# Patient Record
Sex: Male | Born: 1940 | Race: Black or African American | Hispanic: No | Marital: Married | Smoking: Former smoker
Health system: Southern US, Community
[De-identification: ages and names within clinical notes are randomized; demographics above are authoritative.]

## PROBLEM LIST (undated history)

## (undated) DIAGNOSIS — C61 Malignant neoplasm of prostate: Secondary | ICD-10-CM

## (undated) DIAGNOSIS — I480 Paroxysmal atrial fibrillation: Secondary | ICD-10-CM

## (undated) DIAGNOSIS — Z86718 Personal history of other venous thrombosis and embolism: Secondary | ICD-10-CM

## (undated) DIAGNOSIS — N182 Chronic kidney disease, stage 2 (mild): Secondary | ICD-10-CM

## (undated) DIAGNOSIS — Z86711 Personal history of pulmonary embolism: Secondary | ICD-10-CM

## (undated) DIAGNOSIS — I2510 Atherosclerotic heart disease of native coronary artery without angina pectoris: Secondary | ICD-10-CM

## (undated) DIAGNOSIS — I5030 Unspecified diastolic (congestive) heart failure: Secondary | ICD-10-CM

## (undated) DIAGNOSIS — R600 Localized edema: Secondary | ICD-10-CM

## (undated) DIAGNOSIS — I252 Old myocardial infarction: Secondary | ICD-10-CM

## (undated) DIAGNOSIS — E119 Type 2 diabetes mellitus without complications: Secondary | ICD-10-CM

## (undated) DIAGNOSIS — Z87438 Personal history of other diseases of male genital organs: Secondary | ICD-10-CM

## (undated) DIAGNOSIS — I10 Essential (primary) hypertension: Secondary | ICD-10-CM

## (undated) DIAGNOSIS — N529 Male erectile dysfunction, unspecified: Secondary | ICD-10-CM

## (undated) DIAGNOSIS — E785 Hyperlipidemia, unspecified: Secondary | ICD-10-CM

## (undated) DIAGNOSIS — E291 Testicular hypofunction: Secondary | ICD-10-CM

## (undated) DIAGNOSIS — Z87898 Personal history of other specified conditions: Secondary | ICD-10-CM

## (undated) DIAGNOSIS — M1990 Unspecified osteoarthritis, unspecified site: Secondary | ICD-10-CM

## (undated) HISTORY — DX: Hyperlipidemia, unspecified: E78.5

## (undated) HISTORY — PX: SKIN GRAFT: SHX250

## (undated) HISTORY — DX: Atherosclerotic heart disease of native coronary artery without angina pectoris: I25.10

## (undated) HISTORY — PX: HEMORRHOID SURGERY: SHX153

## (undated) HISTORY — PX: PROSTATE BIOPSY: SHX241

## (undated) HISTORY — PX: CORONARY ARTERY BYPASS GRAFT: SHX141

## (undated) HISTORY — PX: TRANSTHORACIC ECHOCARDIOGRAM: SHX275

## (undated) HISTORY — PX: CARDIOVASCULAR STRESS TEST: SHX262

## (undated) HISTORY — PX: CARDIAC CATHETERIZATION: SHX172

## (undated) HISTORY — DX: Essential (primary) hypertension: I10

## (undated) HISTORY — DX: Unspecified osteoarthritis, unspecified site: M19.90

---

## 1999-07-08 ENCOUNTER — Ambulatory Visit (HOSPITAL_COMMUNITY): Admission: RE | Admit: 1999-07-08 | Discharge: 1999-07-08 | Payer: Self-pay | Admitting: Urology

## 2001-07-25 ENCOUNTER — Encounter (INDEPENDENT_AMBULATORY_CARE_PROVIDER_SITE_OTHER): Payer: Self-pay | Admitting: Specialist

## 2001-07-25 ENCOUNTER — Ambulatory Visit (HOSPITAL_COMMUNITY): Admission: RE | Admit: 2001-07-25 | Discharge: 2001-07-25 | Payer: Self-pay | Admitting: *Deleted

## 2007-04-12 ENCOUNTER — Encounter: Admission: RE | Admit: 2007-04-12 | Discharge: 2007-04-12 | Payer: Self-pay | Admitting: Internal Medicine

## 2008-06-20 ENCOUNTER — Encounter: Admission: RE | Admit: 2008-06-20 | Discharge: 2008-09-18 | Payer: Self-pay | Admitting: Internal Medicine

## 2009-07-03 DIAGNOSIS — I480 Paroxysmal atrial fibrillation: Secondary | ICD-10-CM

## 2009-07-03 HISTORY — DX: Paroxysmal atrial fibrillation: I48.0

## 2009-07-20 ENCOUNTER — Ambulatory Visit: Payer: Self-pay | Admitting: Cardiology

## 2009-07-20 ENCOUNTER — Inpatient Hospital Stay (HOSPITAL_COMMUNITY): Admission: EM | Admit: 2009-07-20 | Discharge: 2009-07-28 | Payer: Self-pay | Admitting: Emergency Medicine

## 2009-07-20 DIAGNOSIS — I252 Old myocardial infarction: Secondary | ICD-10-CM

## 2009-07-20 HISTORY — DX: Old myocardial infarction: I25.2

## 2009-07-22 ENCOUNTER — Encounter: Payer: Self-pay | Admitting: Cardiology

## 2009-07-22 ENCOUNTER — Ambulatory Visit: Payer: Self-pay | Admitting: Cardiothoracic Surgery

## 2009-07-23 ENCOUNTER — Encounter: Payer: Self-pay | Admitting: Cardiothoracic Surgery

## 2009-07-24 ENCOUNTER — Encounter: Payer: Self-pay | Admitting: Cardiology

## 2009-08-02 DIAGNOSIS — I5030 Unspecified diastolic (congestive) heart failure: Secondary | ICD-10-CM

## 2009-08-02 HISTORY — DX: Unspecified diastolic (congestive) heart failure: I50.30

## 2009-08-05 ENCOUNTER — Ambulatory Visit: Payer: Self-pay | Admitting: Cardiology

## 2009-08-05 ENCOUNTER — Inpatient Hospital Stay (HOSPITAL_COMMUNITY): Admission: EM | Admit: 2009-08-05 | Discharge: 2009-08-17 | Payer: Self-pay | Admitting: Emergency Medicine

## 2009-08-05 DIAGNOSIS — Z86718 Personal history of other venous thrombosis and embolism: Secondary | ICD-10-CM

## 2009-08-05 DIAGNOSIS — Z86711 Personal history of pulmonary embolism: Secondary | ICD-10-CM

## 2009-08-05 HISTORY — DX: Personal history of pulmonary embolism: Z86.711

## 2009-08-05 HISTORY — DX: Personal history of other venous thrombosis and embolism: Z86.718

## 2009-08-06 ENCOUNTER — Encounter (INDEPENDENT_AMBULATORY_CARE_PROVIDER_SITE_OTHER): Payer: Self-pay | Admitting: Internal Medicine

## 2009-08-06 ENCOUNTER — Ambulatory Visit: Payer: Self-pay | Admitting: Surgery

## 2009-08-06 ENCOUNTER — Ambulatory Visit: Payer: Self-pay | Admitting: Oncology

## 2009-08-19 ENCOUNTER — Ambulatory Visit: Payer: Self-pay | Admitting: Cardiology

## 2009-08-19 ENCOUNTER — Encounter: Payer: Self-pay | Admitting: Cardiology

## 2009-08-19 LAB — CONVERTED CEMR LAB

## 2009-08-26 ENCOUNTER — Ambulatory Visit: Payer: Self-pay | Admitting: Cardiology

## 2009-08-29 ENCOUNTER — Encounter (HOSPITAL_COMMUNITY): Admission: RE | Admit: 2009-08-29 | Discharge: 2009-11-01 | Payer: Self-pay | Admitting: Cardiology

## 2009-08-30 DIAGNOSIS — E785 Hyperlipidemia, unspecified: Secondary | ICD-10-CM | POA: Insufficient documentation

## 2009-08-30 DIAGNOSIS — E669 Obesity, unspecified: Secondary | ICD-10-CM | POA: Insufficient documentation

## 2009-08-30 DIAGNOSIS — I4891 Unspecified atrial fibrillation: Secondary | ICD-10-CM | POA: Insufficient documentation

## 2009-08-30 DIAGNOSIS — E1169 Type 2 diabetes mellitus with other specified complication: Secondary | ICD-10-CM | POA: Insufficient documentation

## 2009-08-30 DIAGNOSIS — I25810 Atherosclerosis of coronary artery bypass graft(s) without angina pectoris: Secondary | ICD-10-CM | POA: Insufficient documentation

## 2009-08-30 DIAGNOSIS — Z86711 Personal history of pulmonary embolism: Secondary | ICD-10-CM | POA: Insufficient documentation

## 2009-08-30 DIAGNOSIS — I82409 Acute embolism and thrombosis of unspecified deep veins of unspecified lower extremity: Secondary | ICD-10-CM | POA: Insufficient documentation

## 2009-08-30 DIAGNOSIS — I5032 Chronic diastolic (congestive) heart failure: Secondary | ICD-10-CM | POA: Insufficient documentation

## 2009-08-30 DIAGNOSIS — I2789 Other specified pulmonary heart diseases: Secondary | ICD-10-CM | POA: Insufficient documentation

## 2009-08-30 DIAGNOSIS — I219 Acute myocardial infarction, unspecified: Secondary | ICD-10-CM | POA: Insufficient documentation

## 2009-08-30 DIAGNOSIS — I498 Other specified cardiac arrhythmias: Secondary | ICD-10-CM | POA: Insufficient documentation

## 2009-09-02 ENCOUNTER — Ambulatory Visit: Payer: Self-pay | Admitting: Cardiovascular Disease

## 2009-09-04 ENCOUNTER — Telehealth: Payer: Self-pay | Admitting: Cardiology

## 2009-09-04 ENCOUNTER — Ambulatory Visit: Payer: Self-pay | Admitting: Cardiology

## 2009-09-10 ENCOUNTER — Ambulatory Visit: Payer: Self-pay | Admitting: Cardiology

## 2009-09-10 LAB — CONVERTED CEMR LAB: POC INR: 3.1

## 2009-09-12 ENCOUNTER — Encounter: Payer: Self-pay | Admitting: Cardiology

## 2009-09-12 ENCOUNTER — Ambulatory Visit: Payer: Self-pay | Admitting: Cardiothoracic Surgery

## 2009-09-12 ENCOUNTER — Encounter: Admission: RE | Admit: 2009-09-12 | Discharge: 2009-09-12 | Payer: Self-pay | Admitting: Cardiothoracic Surgery

## 2009-09-13 ENCOUNTER — Ambulatory Visit: Payer: Self-pay | Admitting: Cardiology

## 2009-09-13 LAB — CONVERTED CEMR LAB
Chloride: 103 meq/L (ref 96–112)
Magnesium: 2 mg/dL (ref 1.5–2.5)
Potassium: 4.4 meq/L (ref 3.5–5.1)
Sodium: 142 meq/L (ref 135–145)

## 2009-09-18 ENCOUNTER — Telehealth: Payer: Self-pay | Admitting: Cardiology

## 2009-09-20 ENCOUNTER — Telehealth: Payer: Self-pay | Admitting: Cardiology

## 2009-09-23 ENCOUNTER — Telehealth: Payer: Self-pay | Admitting: Cardiology

## 2009-09-24 ENCOUNTER — Ambulatory Visit: Payer: Self-pay | Admitting: Cardiovascular Disease

## 2009-09-24 LAB — CONVERTED CEMR LAB: POC INR: 1.5

## 2009-09-27 ENCOUNTER — Encounter: Payer: Self-pay | Admitting: Cardiology

## 2009-10-02 ENCOUNTER — Telehealth: Payer: Self-pay | Admitting: Cardiology

## 2009-10-08 ENCOUNTER — Ambulatory Visit: Payer: Self-pay | Admitting: Internal Medicine

## 2009-10-08 LAB — CONVERTED CEMR LAB: POC INR: 2.1

## 2009-10-16 ENCOUNTER — Encounter: Payer: Self-pay | Admitting: Cardiology

## 2009-10-18 ENCOUNTER — Ambulatory Visit: Payer: Self-pay | Admitting: Cardiology

## 2009-10-18 DIAGNOSIS — I252 Old myocardial infarction: Secondary | ICD-10-CM | POA: Insufficient documentation

## 2009-10-23 ENCOUNTER — Ambulatory Visit: Payer: Self-pay | Admitting: Cardiology

## 2009-10-23 DIAGNOSIS — R609 Edema, unspecified: Secondary | ICD-10-CM | POA: Insufficient documentation

## 2009-10-28 ENCOUNTER — Telehealth: Payer: Self-pay | Admitting: Cardiology

## 2009-10-28 LAB — CONVERTED CEMR LAB
ALT: 87 units/L — ABNORMAL HIGH (ref 0–53)
Albumin: 3.8 g/dL (ref 3.5–5.2)
CO2: 26 meq/L (ref 19–32)
Calcium: 9.1 mg/dL (ref 8.4–10.5)
Chloride: 107 meq/L (ref 96–112)
Cholesterol: 197 mg/dL (ref 0–200)
Creatinine, Ser: 1.1 mg/dL (ref 0.4–1.5)
Glucose, Bld: 127 mg/dL — ABNORMAL HIGH (ref 70–99)
HDL: 63.6 mg/dL (ref >39.00)
Total Protein: 6.8 g/dL (ref 6.0–8.3)
Triglycerides: 118 mg/dL (ref 0.0–149.0)

## 2009-11-02 ENCOUNTER — Encounter (HOSPITAL_COMMUNITY): Admission: RE | Admit: 2009-11-02 | Discharge: 2009-12-06 | Payer: Self-pay | Admitting: Cardiology

## 2009-11-05 ENCOUNTER — Ambulatory Visit: Payer: Self-pay | Admitting: Cardiology

## 2009-11-27 ENCOUNTER — Ambulatory Visit: Payer: Self-pay | Admitting: Internal Medicine

## 2009-11-27 ENCOUNTER — Ambulatory Visit: Payer: Self-pay | Admitting: Cardiology

## 2009-11-27 LAB — CONVERTED CEMR LAB: POC INR: 1.6

## 2009-11-29 ENCOUNTER — Telehealth (INDEPENDENT_AMBULATORY_CARE_PROVIDER_SITE_OTHER): Payer: Self-pay | Admitting: *Deleted

## 2009-12-09 ENCOUNTER — Telehealth: Payer: Self-pay | Admitting: Cardiology

## 2009-12-10 ENCOUNTER — Ambulatory Visit: Payer: Self-pay | Admitting: Cardiology

## 2009-12-11 ENCOUNTER — Telehealth (INDEPENDENT_AMBULATORY_CARE_PROVIDER_SITE_OTHER): Payer: Self-pay | Admitting: *Deleted

## 2009-12-11 ENCOUNTER — Ambulatory Visit: Payer: Self-pay | Admitting: Cardiovascular Disease

## 2009-12-11 LAB — CONVERTED CEMR LAB: POC INR: 1.9

## 2009-12-12 ENCOUNTER — Telehealth: Payer: Self-pay | Admitting: Cardiology

## 2009-12-12 ENCOUNTER — Ambulatory Visit (HOSPITAL_COMMUNITY): Admission: RE | Admit: 2009-12-12 | Discharge: 2009-12-12 | Payer: Self-pay | Admitting: Cardiology

## 2009-12-12 DIAGNOSIS — R05 Cough: Secondary | ICD-10-CM

## 2009-12-12 DIAGNOSIS — R059 Cough, unspecified: Secondary | ICD-10-CM | POA: Insufficient documentation

## 2009-12-19 ENCOUNTER — Ambulatory Visit: Payer: Self-pay | Admitting: Cardiothoracic Surgery

## 2009-12-19 ENCOUNTER — Encounter: Payer: Self-pay | Admitting: Cardiology

## 2010-01-01 ENCOUNTER — Ambulatory Visit: Payer: Self-pay | Admitting: Internal Medicine

## 2010-01-22 ENCOUNTER — Ambulatory Visit: Payer: Self-pay | Admitting: Cardiology

## 2010-02-25 ENCOUNTER — Ambulatory Visit
Admission: RE | Admit: 2010-02-25 | Discharge: 2010-02-25 | Payer: Self-pay | Source: Home / Self Care | Attending: Cardiology | Admitting: Cardiology

## 2010-02-25 ENCOUNTER — Other Ambulatory Visit: Payer: Self-pay | Admitting: Cardiology

## 2010-02-25 ENCOUNTER — Ambulatory Visit: Admission: RE | Admit: 2010-02-25 | Discharge: 2010-02-25 | Payer: Self-pay | Source: Home / Self Care

## 2010-02-25 ENCOUNTER — Encounter: Payer: Self-pay | Admitting: Cardiology

## 2010-02-25 DIAGNOSIS — I10 Essential (primary) hypertension: Secondary | ICD-10-CM | POA: Insufficient documentation

## 2010-02-25 DIAGNOSIS — I8010 Phlebitis and thrombophlebitis of unspecified femoral vein: Secondary | ICD-10-CM | POA: Insufficient documentation

## 2010-02-25 LAB — CONVERTED CEMR LAB
INR: 1.9
POC INR: 1.9

## 2010-02-25 LAB — CBC WITH DIFFERENTIAL/PLATELET
Basophils Relative: 0.2 % (ref 0.0–3.0)
Eosinophils Relative: 3.1 % (ref 0.0–5.0)
HCT: 41.5 % (ref 39.0–52.0)
Lymphs Abs: 3.2 10*3/uL (ref 0.7–4.0)
MCV: 85 fl (ref 78.0–100.0)
Monocytes Absolute: 0.6 10*3/uL (ref 0.1–1.0)
Neutro Abs: 3.6 10*3/uL (ref 1.4–7.7)
Platelets: 185 10*3/uL (ref 150.0–400.0)
WBC: 7.7 10*3/uL (ref 4.5–10.5)

## 2010-02-27 ENCOUNTER — Encounter: Payer: Self-pay | Admitting: Cardiology

## 2010-03-06 NOTE — Letter (Signed)
Summary: [facility] Office Visit   [facility] Office Visit   Imported By: Roderic Ovens 10/18/2009 16:16:59  _____________________________________________________________________  External Attachment:    Type:   Image     Comment:   External Document

## 2010-03-06 NOTE — Letter (Signed)
Summary: [facility]  [facility]   Imported By: Marylou Mccoy 09/26/2009 09:38:04  _____________________________________________________________________  External Attachment:    Type:   Image     Comment:   External Document

## 2010-03-06 NOTE — Progress Notes (Signed)
  Phone Note Outgoing Call   Call placed by: Lisabeth Devoid RN,  December 12, 2009 10:26 AM Call placed to: Patient Summary of Call: cough  Follow-up for Phone Call        I spoke with patient and he will go to [facility] for pa &lat chest xray due to cough.  He will stay of losartan. Mylo Red RN  New Problems: COUGH [phone])   New Problems: COUGH (ICD-786.2)  Appended Document:   Reviewed Juanito Doom, MD
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
## 2010-03-06 NOTE — Medication Information (Signed)
Summary: rov/ewj  Anticoagulant Therapy  Managed by: Leota Sauers, PharmD, BCPS, CPP Referring MD: Dr Daleen Squibb PCP: Dr Kellie Shropshire Supervising MD: Gala Romney MD, Reuel Boom Indication 1: DVT (451.1) Indication 2: Pulmonary Embolism (415.1) Valve Type: atrial fibrillation (427.31) Lab Used: [facility] [place] Site: [address] INR POC 2.1 INR RANGE 2.0-3.0  Dietary changes: no    Health status changes: no    Bleeding/hemorrhagic complications: no    Recent/future hospitalizations: no    Any changes in medication regimen? no    Recent/future dental: no  Any missed doses?: no       Is patient compliant with meds? yes       Current Medications (verified): 1)  Amiodarone Hcl 400 Mg Tabs (Amiodarone Hcl) .Marland Kitchen.. 1 Tablet Daily 2)  Warfarin Sodium 5 Mg Tabs (Warfarin Sodium) .... Take As Directed By Coumadin Clinic. 3)  Ferrous Sulfate 325 (65 Fe) Mg Tabs (Ferrous Sulfate) .Marland Kitchen.. 1 Tab Two Times A Day 4)  Furosemide 40 Mg Tabs (Furosemide) .Marland Kitchen.. 1 Tab Once Daily 5)  Metformin Hcl 500 Mg Tabs (Metformin Hcl) .Marland Kitchen.. 1 Tab Two Times A Day 6)  Aspirin Ec 325 Mg Tbec (Aspirin) .... Take One Tablet By Mouth Daily 7)  Metoprolol Tartrate 25 Mg Tabs (Metoprolol Tartrate) .... 1/2 Tab Two Times A Day 8)  Actos 30 Mg Tabs (Pioglitazone Hcl) .Marland Kitchen.. 1 Tab Once Daily 9)  Crestor 40 Mg Tabs (Rosuvastatin Calcium) .Marland Kitchen.. 1 Tab Once Daily 10)  Miralax  Powd (Polyethylene Glycol 3350) .... Use As Directed  Allergies (verified): 1)  ! Heparin  Anticoagulation Management History:      The patient is taking warfarin and comes in today for a routine follow up visit.  Positive risk factors for bleeding include an age of 98 years or older and presence of serious comorbidities.  The bleeding index is 'intermediate risk'.  Positive CHADS2 values include History of CHF and History of Diabetes.  Negative CHADS2 values include Age > 49 years old.  Anticoagulation responsible provider: Bensimhon MD, Reuel Boom.   INR POC: 2.1.  Cuvette Lot#: [id number].  Exp: 11/2010.    Anticoagulation Management Assessment/Plan:      The patient's current anticoagulation dose is Warfarin sodium 5 mg tabs: Take as directed by coumadin clinic..  The target INR is 2.0-3.0.  The next INR is due 11/05/2009.  Anticoagulation instructions were given to patient.  Results were reviewed/authorized by Leota Sauers, PharmD, BCPS, CPP.         Prior Anticoagulation Instructions: INR 1.5  Take 1.5 tablets today, then take 1 tablet tomorrow, then resume same dosage 1 tablet daily except 1/2 tablet on Mondays, Wednesdays, and Fridays.  Recheck in 2 weeks.    Current Anticoagulation Instructions: INR 2.1  coumadin 1 tab = 5mg  Tu, Thu, Sat and Sun 1/2 tab = 2.5mg  Mon, Wed, Fri

## 2010-03-06 NOTE — Medication Information (Signed)
Summary: rov/sp  Anticoagulant Therapy  Managed by: Leota Sauers, PharmD, BCPS, CPP Referring MD: Dr Daleen Squibb PCP: Dr Kellie Shropshire Supervising MD: Gala Romney MD, Reuel Boom Indication 1: DVT (451.1) Indication 2: Pulmonary Embolism (415.1) Valve Type: atrial fibrillation (427.31) Lab Used: [facility] [place] Site: [address] INR POC 2.4 INR RANGE 2.0-3.0  Dietary changes: no    Health status changes: no    Bleeding/hemorrhagic complications: no    Recent/future hospitalizations: no    Any changes in medication regimen? no    Recent/future dental: no  Any missed doses?: no       Is patient compliant with meds? yes       Current Medications (verified): 1)  Warfarin Sodium 5 Mg Tabs (Warfarin Sodium) .... Take As Directed By Coumadin Clinic. 2)  Ferrous Sulfate 325 (65 Fe) Mg Tabs (Ferrous Sulfate) .Marland Kitchen.. 1 Tab Two Times A Day 3)  Furosemide 40 Mg Tabs (Furosemide) .... Take 1/2 Tablet Each Day. 4)  Metformin Hcl 500 Mg Tabs (Metformin Hcl) .... Take 1 Tab If Glucose Readings Are From 100-125 ....take 1 1/2 Tab If Readings 126-160...take 2 Tab If Reading Is Over 160 5)  Aspirin 81 Mg Tbec (Aspirin) .... Take One Tablet By Mouth Daily 6)  Crestor 40 Mg Tabs (Rosuvastatin Calcium) .Marland Kitchen.. 1 Tab Once Daily 7)  Miralax  Powd (Polyethylene Glycol 3350) .... Use As Directed 8)  Metamucil 30.9 % Powd (Psyllium) .... As Needed 9)  Losartan Potassium 25 Mg Tabs (Losartan Potassium) .... Take 1 Tablet Daily 10)  Viagra 50 Mg Tabs (Sildenafil Citrate) .... Take 1 Tablet As Needed 11)  Zyrtec Allergy 10 Mg Tabs (Cetirizine Hcl) .... Once Daily Prn  Allergies (verified): 1)  ! Heparin  Anticoagulation Management History:      The patient is taking warfarin and comes in today for a routine follow up visit.  Positive risk factors for bleeding include an age of 70 years or older and presence of serious comorbidities.  The bleeding index is 'intermediate risk'.  Positive CHADS2 values  include History of CHF and History of Diabetes.  Negative CHADS2 values include Age > 70 years old.  Anticoagulation responsible Viet Kemmerer: Bensimhon MD, Reuel Boom.  INR POC: 2.4.  Cuvette Lot#: [id number].  Exp: 01/2011.    Anticoagulation Management Assessment/Plan:      The patient's current anticoagulation dose is Warfarin sodium 5 mg tabs: Take as directed by coumadin clinic..  The target INR is 2.0-3.0.  The next INR is due 02/25/2010.  Anticoagulation instructions were given to patient.  Results were reviewed/authorized by Leota Sauers, PharmD, BCPS, CPP.         Prior Anticoagulation Instructions: INR 2.4  Continue taking Coumadin 1 tab (5 mg) every day except for Coumadin 0.5 tab (2.5 mg) on Mondays. Return to clinic in 3 weeks.   Current Anticoagulation Instructions: INR 2.4  Coumadin 5mg  tab  - take 1 tab each day EXCEPT 1/2 tab on MON

## 2010-03-06 NOTE — Medication Information (Signed)
Summary: rov coumadin - [facility]  Anticoagulant Therapy  Managed by: Weston Brass, PharmD Referring MD: Dr Daleen Squibb PCP: Dr Kellie Shropshire Supervising MD: Shirlee Latch MD, Freida Busman Indication 1: DVT (451.1) Indication 2: Pulmonary Embolism (415.1) Valve Type: atrial fibrillation (427.31) Lab Used: [facility] [place] Site: [address] INR POC 2.2 INR RANGE 2.0-3.0  Dietary changes: no    Health status changes: no    Bleeding/hemorrhagic complications: no    Recent/future hospitalizations: no    Any changes in medication regimen? yes       Details: Amiodarone is now 200mg  instead of 400mg  2 weeks ago.  Recent/future dental: no  Any missed doses?: no       Is patient compliant with meds? yes       Current Medications (verified): 1)  Amiodarone Hcl 400 Mg Tabs (Amiodarone Hcl) .... 1/2 Tab Once A Day 2)  Warfarin Sodium 5 Mg Tabs (Warfarin Sodium) .... Take As Directed By Coumadin Clinic. 3)  Ferrous Sulfate 325 (65 Fe) Mg Tabs (Ferrous Sulfate) .Marland Kitchen.. 1 Tab Two Times A Day 4)  Furosemide 40 Mg Tabs (Furosemide) .... Take 1/2 Tablet Each Day. 5)  Metformin Hcl 500 Mg Tabs (Metformin Hcl) .... Take 1 Tab If Glucose Readings Are From 100-125 ....take 1 1/2 Tab If Readings 126-160...take 2 Tab If Reading Is Over 160 6)  Aspirin Ec 325 Mg Tbec (Aspirin) .... Take One Tablet By Mouth Daily 7)  Metoprolol Tartrate 25 Mg Tabs (Metoprolol Tartrate) .... 1/2 Tab Once A Day For 1 Week.  Then Stop. 8)  Crestor 40 Mg Tabs (Rosuvastatin Calcium) .Marland Kitchen.. 1 Tab Once Daily 9)  Miralax  Powd (Polyethylene Glycol 3350) .... Use As Directed  Allergies: 1)  ! Heparin  Anticoagulation Management History:      The patient is taking warfarin and comes in today for a routine follow up visit.  Positive risk factors for bleeding include an age of 70 years or older and presence of serious comorbidities.  The bleeding index is 'intermediate risk'.  Positive CHADS2 values include History of CHF and History of  Diabetes.  Negative CHADS2 values include Age > 72 years old.  Anticoagulation responsible Sheilah Rayos: Shirlee Latch MD, [place].  INR POC: 2.2.  Cuvette Lot#: [id number].  Exp: 12/2010.    Anticoagulation Management Assessment/Plan:      The patient's current anticoagulation dose is Warfarin sodium 5 mg tabs: Take as directed by coumadin clinic..  The target INR is 2.0-3.0.  The next INR is due 12/03/2009.  Anticoagulation instructions were given to patient.  Results were reviewed/authorized by Weston Brass, PharmD.  He was notified by Ilean Skill D candidate.         Prior Anticoagulation Instructions: INR 2.1  coumadin 1 tab = 5mg  Tu, Thu, Sat and Sun 1/2 tab = 2.5mg  Mon, Wed, Fri  Current Anticoagulation Instructions: INR 2.2  Continue to take 1 tablet everyday except for 1/2 tablet on Monday, Wednesday, and Friday. Recheck in 4 weeks.
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
## 2010-03-06 NOTE — Medication Information (Signed)
Summary: rov/tm  Anticoagulant Therapy  Managed by: Reina Fuse, PharmD Referring MD: Dr Daleen Squibb PCP: Dr Kellie Shropshire Supervising MD: Myrtis Ser MD, Tinnie Gens Indication 1: DVT (451.1) Indication 2: Pulmonary Embolism (415.1) Valve Type: atrial fibrillation (427.31) Lab Used: [facility] [place] Site: [address] INR POC 3.1 INR RANGE 2.0-3.0  Dietary changes: no    Health status changes: no    Bleeding/hemorrhagic complications: no    Recent/future hospitalizations: no    Any changes in medication regimen? yes       Details: Stopped protonix, MVI, and docusate since visit with Dr. Daleen Squibb.  Recent/future dental: no  Any missed doses?: no       Is patient compliant with meds? yes       Current Medications (verified): 1)  Amiodarone Hcl 400 Mg Tabs (Amiodarone Hcl) .Marland Kitchen.. 1 Tablet Daily 2)  Warfarin Sodium 5 Mg Tabs (Warfarin Sodium) .... Take As Directed By Coumadin Clinic. 3)  Ferrous Sulfate 325 (65 Fe) Mg Tabs (Ferrous Sulfate) .Marland Kitchen.. 1 Tab Two Times A Day 4)  Furosemide 40 Mg Tabs (Furosemide) .Marland Kitchen.. 1 Tab Once Daily 5)  Metformin Hcl 500 Mg Tabs (Metformin Hcl) .Marland Kitchen.. 1 Tab Two Times A Day 6)  Aspirin Ec 325 Mg Tbec (Aspirin) .... Take One Tablet By Mouth Daily 7)  Metoprolol Tartrate 25 Mg Tabs (Metoprolol Tartrate) .... 1/2 Tab Two Times A Day 8)  Actos 30 Mg Tabs (Pioglitazone Hcl) .Marland Kitchen.. 1 Tab Once Daily 9)  Crestor 40 Mg Tabs (Rosuvastatin Calcium) .Marland Kitchen.. 1 Tab Once Daily 10)  Miralax  Powd (Polyethylene Glycol 3350) .... Use As Directed  Allergies (verified): 1)  ! Heparin  Anticoagulation Management History:      The patient is taking warfarin and comes in today for a routine follow up visit.  Positive risk factors for bleeding include an age of 70 years or older and presence of serious comorbidities.  The bleeding index is 'intermediate risk'.  Positive CHADS2 values include History of CHF and History of Diabetes.  Negative CHADS2 values include Age > 8 years old.   Anticoagulation responsible provider: Myrtis Ser MD, Tinnie Gens.  INR POC: 3.1.  Cuvette Lot#: [id number].  Exp: 11/2010.    Anticoagulation Management Assessment/Plan:      The patient's current anticoagulation dose is Warfarin sodium 5 mg tabs: Take as directed by coumadin clinic..  The target INR is 2.0-3.0.  The next INR is due 09/24/2009.  Anticoagulation instructions were given to patient.  Results were reviewed/authorized by Reina Fuse, PharmD.  He was notified by Reina Fuse PharmD.         Prior Anticoagulation Instructions: INR 2.4 Continue 1 pill everyday except 1/2 pill on Mondays, Wednesdays and Fridays. Recheck in one week.   Current Anticoagulation Instructions: INR 3.1  Hold Coumadin today. Then continue Coumadin 1 tab (5 mg) on Sun, Tues, Thur, Sat and Coumadin 0.5 tab (2.5 mg). Return to clinic in 2 weeks.

## 2010-03-06 NOTE — Progress Notes (Signed)
Summary: [facility]: Office Visit  [facility]: Office Visit   Imported By: Earl Many 02/11/2010 16:29:24  _____________________________________________________________________  External Attachment:    Type:   Image     Comment:   External Document

## 2010-03-06 NOTE — Miscellaneous (Signed)
Summary: [facility] Progress Note   [facility]   Imported By: Roderic Ovens 08/29/2009 15:54:31  _____________________________________________________________________  External Attachment:    Type:   Image     Comment:   External Document

## 2010-03-06 NOTE — Medication Information (Signed)
Summary: rov coumadin - [facility]  seeing TW also  Anticoagulant Therapy  Managed by: Louann Sjogren, PharmD Referring MD: Dr Daleen Squibb PCP: Dr Kellie Shropshire Supervising MD: Graciela Husbands MD,Kristinia Leavy Indication 1: DVT (451.1) Indication 2: Pulmonary Embolism (415.1) Valve Type: atrial fibrillation (427.31) Lab Used: [facility] [place] Site: [address] INR POC 1.9 INR RANGE 2.0-3.0  Dietary changes: no    Health status changes: no    Bleeding/hemorrhagic complications: no    Recent/future hospitalizations: no    Any changes in medication regimen? no    Recent/future dental: no  Any missed doses?: no       Is patient compliant with meds? yes       Allergies: 1)  ! Heparin  Anticoagulation Management History:      The patient is taking warfarin and comes in today for a routine follow up visit.  Positive risk factors for bleeding include an age of 33 years or older and presence of serious comorbidities.  The bleeding index is 'intermediate risk'.  Positive CHADS2 values include History of CHF and History of Diabetes.  Negative CHADS2 values include Age > 59 years old.  Today's INR is 1.9.  Anticoagulation responsible provider: Graciela Husbands MD,Whitlee Sluder.  INR POC: 1.9.  Cuvette Lot#: [id number].  Exp: 01/2011.    Anticoagulation Management Assessment/Plan:      The patient's current anticoagulation dose is Warfarin sodium 5 mg tabs: Take as directed by coumadin clinic..  The target INR is 2.0-3.0.  The next INR is due 03/25/2010.  Anticoagulation instructions were given to patient.  Results were reviewed/authorized by Louann Sjogren, PharmD.  He was notified by Louann Sjogren PharmD.         Prior Anticoagulation Instructions: INR 2.4  Coumadin 5mg  tab  - take 1 tab each day EXCEPT 1/2 tab on MON  Current Anticoagulation Instructions: INR 1.9 (goal 2-3)  Continue take 1 tablet everyday except take 1/2 tablet on Mondays. Return for next INR check in 4 weeks.

## 2010-03-06 NOTE — Progress Notes (Signed)
Summary: can pt start rehab?  Phone Note Call from Patient   Caller: Patient Reason for Call: Talk to Nurse Summary of Call: pt in today, forgot to ask if he can start rehab- pls call [phone] Initial call taken by: Glynda Jaeger,  September 04, 2009 2:56 PM  Follow-up for Phone Call        yes can start. Follow-up by: Gaylord Shih, MD, [facility],  September 04, 2009 5:43 PM     Appended Document: can pt start rehab? Mr. Ruppert is aware and will restart rehab. Mylo Red RN

## 2010-03-06 NOTE — Progress Notes (Signed)
Summary: Records Request  Faxed OV & EKG [facility] ([phone]). Debby Freiberg  November 29, 2009 2:50 PM

## 2010-03-06 NOTE — Medication Information (Signed)
Summary: [facility] / coumadin / [facility] over [facility]  Anticoagulant Therapy  Managed by: Cloyde Reams, RN, BSN Referring MD: Dr Daleen Squibb PCP: Dr Kellie Shropshire Supervising MD: Antoine Poche MD, Fayrene Fearing Indication 1: DVT (451.1) Indication 2: Pulmonary Embolism (415.1) Valve Type: atrial fibrillation (427.31) Lab Used: [facility] [place] Site: [address] INR POC  3.0 INR RANGE 2.0-3.0           Anticoagulation Management History:      The patient is taking warfarin and comes in today for a routine follow up visit.  Positive risk factors for bleeding include an age of 70 years or older.  The bleeding index is 'intermediate risk'.  Negative CHADS2 values include Age > 70 years old.  Anticoagulation responsible provider: Antoine Poche MD, Fayrene Fearing.  INR POC:  3.0.  Cuvette Lot#: [id number].  Exp: 11/2010.    Anticoagulation Management Assessment/Plan:      The patient's current anticoagulation dose is Warfarin sodium 5 mg tabs: Take as directed by coumadin clinic..  The target INR is 2.0-3.0.  The next INR is due 08/26/2009.  Results were reviewed/authorized by Cloyde Reams, RN, BSN.  He was notified by Cloyde Reams RN.         Current Anticoagulation Instructions: INR 3.0  Start taking 1 tablet daily except 1/2 tablet on Mondays and Fridays.  Recheck in 1 week.    Appended Document: Coumadin Clinic    Anticoagulant Therapy  Managed by: Cloyde Reams, RN, BSN Referring MD: Dr Daleen Squibb PCP: Dr Kellie Shropshire Supervising MD: Antoine Poche MD, Fayrene Fearing Indication 1: DVT (451.1) Indication 2: Pulmonary Embolism (415.1) Valve Type: atrial fibrillation (427.31) Lab Used: [facility] [place] Site: [address] INR POC 3.0 INR RANGE 2.0-3.0  Dietary changes: yes       Details: Pt edcuated on importance of consistancy of vit K in the diet.   Health status changes: no    Bleeding/hemorrhagic complications: yes       Details: Pt educated to monitor for s+s of bleeding  and to call with any problems.  Recent/future hospitalizations: yes       Details: [facility].   Any changes in medication regimen? yes       Details: Advised pt to call with any medication changes, cautioned pt on medication interactions.   Recent/future dental: no  Any missed doses?: no       Is patient compliant with meds? no     Details: Pt educated on importance of medication compliance, taking daily at same time, call with any missed doses, do not double up.  Comments: Pt in hospital 08/05/09-08/17/09. Started on Amiodarone 400mg  three times a day while in hospital also started on coumadin 7.5mg  x 5 doses while in hospital 2 doses on 7/4-7/5 held coumadin 7/6-7/10 tooke 5mg  on 7/11 none on 7/12, then took 3 doses of 7.5mg  7/13-7/15.  Discharged on 5mg  daily.  INR on 7/16 2.30  Anticoagulation Management History:      The patient is taking warfarin and comes in today for a routine follow up visit.  Positive risk factors for bleeding include an age of 70 years or older.  The bleeding index is 'intermediate risk'.  Negative CHADS2 values include Age > 70 years old.  Anticoagulation responsible provider: Antoine Poche MD, Fayrene Fearing.  INR POC: 3.0.  Exp: 11/2010.    Anticoagulation Management Assessment/Plan:      The patient's current anticoagulation dose is Warfarin sodium 5 mg tabs:  Take as directed by coumadin clinic..  The target INR is 2.0-3.0.  The next INR is due 08/26/2009.  Results were reviewed/authorized by Cloyde Reams, RN, BSN.  He was notified by Cloyde Reams RN.         Prior Anticoagulation Instructions: INR 3.0  Start taking 1 tablet daily except 1/2 tablet on Mondays and Fridays.  Recheck in 1 week.    Current Anticoagulation Instructions: INR 3.0  Start taking 1 tablet daily except 1/2 tablet on Mondays and Fridays.  Recheck in 1 week.

## 2010-03-06 NOTE — Medication Information (Signed)
Summary: rov/sp  Anticoagulant Therapy  Managed by: Bethena Midget, RN, BSN Referring MD: Dr Daleen Squibb PCP: Dr Kellie Shropshire Supervising MD: Eden Emms MD, Theron Arista Indication 1: DVT (451.1) Indication 2: Pulmonary Embolism (415.1) Valve Type: atrial fibrillation (427.31) Lab Used: [facility] [place] Site: [address] INR POC 2.4 INR RANGE 2.0-3.0  Dietary changes: no    Health status changes: no    Bleeding/hemorrhagic complications: no    Recent/future hospitalizations: no    Any changes in medication regimen? no    Recent/future dental: no  Any missed doses?: no       Is patient compliant with meds? yes       Anticoagulation Management History:      The patient is taking warfarin and comes in today for a routine follow up visit.  Positive risk factors for bleeding include an age of 70 years or older and presence of serious comorbidities.  The bleeding index is 'intermediate risk'.  Positive CHADS2 values include History of CHF and History of Diabetes.  Negative CHADS2 values include Age > 59 years old.  Anticoagulation responsible provider: Eden Emms MD, Theron Arista.  INR POC: 2.4.  Cuvette Lot#: [id number].  Exp: 11/2010.    Anticoagulation Management Assessment/Plan:      The patient's current anticoagulation dose is Warfarin sodium 5 mg tabs: Take as directed by coumadin clinic..  The target INR is 2.0-3.0.  The next INR is due 09/10/2009.  Anticoagulation instructions were given to patient.  Results were reviewed/authorized by Bethena Midget, RN, BSN.  He was notified by Bethena Midget, RN, BSN.         Prior Anticoagulation Instructions: INR 3.3  Skip today's dose of Coumadin then decrease dose to 1 tablet every day except 1/2 tablet on Monday, Wednesday and Friday.   Current Anticoagulation Instructions: INR 2.4 Continue 1 pill everyday except 1/2 pill on Mondays, Wednesdays and Fridays. Recheck in one week.

## 2010-03-06 NOTE — Progress Notes (Signed)
Summary: problem with med  Phone Note Call from Patient   Caller: Patient [phone]/ [phone] Reason for Call: Talk to Nurse Summary of Call: pt calling re lisinopril, pt having really bad coughing, wants to changed med to something else-[facility] [address] Initial call taken by: Glynda Jaeger,  December 09, 2009 10:26 AM  Follow-up for Phone Call        Mr.Currier has called today with a persistent cough since starting Lisinopril.  He thought it was allergies or a cold.  He took Zyrtec, Theraflu and " a whole box of cough drops" on Friday with no relief.  He stopped Lisinopril on Saturday and cough is better.  DOD recommended Losartan 25mg  daily and to call back if cough is not better.  Pt understands and will start Losartan today. Mylo Red RN    New/Updated Medications: LOSARTAN POTASSIUM 25 MG TABS (LOSARTAN POTASSIUM) Take 1 tablet daily Prescriptions: LOSARTAN POTASSIUM 25 MG TABS (LOSARTAN POTASSIUM) Take 1 tablet daily  #30 x 6   Entered by:   Lisabeth Devoid RN   Authorized by:   Gaylord Shih, MD, [facility]   Signed by:   Lisabeth Devoid RN on 12/09/2009   Method used:   [facility]        [facility]  [address] [id number]       [address]       [place], [place]  [address]       Ph: [phone] or [phone]       Fax: [phone]   RxID:   [id number]   Appended Document: problem with med Needs CXR.....not Losarten so stay on it.   Appended Document: problem with med Mr. Gacek is aware and will have chest xray at [facility] this am. Mylo Red RN

## 2010-03-06 NOTE — Progress Notes (Signed)
Summary: pt rtn call  Phone Note Call from Patient   Caller: Patient Reason for Call: Talk to Nurse Summary of Call: rtn call -pls call [phone] or [phone] Initial call taken by: Glynda Jaeger,  October 28, 2009 2:29 PM  Follow-up for Phone Call        Mr. Rhue is aware to decrease Amiodarone to 200mg  daily.  He will return for repeat lfts on 11/8. Mylo Red RN     Appended Document: pt rtn call  Reviewed Juanito Doom, MD

## 2010-03-06 NOTE — Miscellaneous (Signed)
Summary: [facility]   [facility]   Imported By: Roderic Ovens 10/08/2009 11:52:27  _____________________________________________________________________  External Attachment:    Type:   Image     Comment:   External Document

## 2010-03-06 NOTE — Consult Note (Signed)
[facility]: [facility]   Imported By: Roderic Ovens 08/17/2009 10:51:44  _____________________________________________________________________  External Attachment:    Type:   Image     Comment:   External Document

## 2010-03-06 NOTE — Progress Notes (Signed)
Summary: rx ferrous sulfate  Phone Note Refill Request Message from:  Patient on September 18, 2009 10:05 AM  Refills Requested: Medication #1:  FERROUS SULFATE 325 (65 FE) MG TABS 1 tab two times a day [facility] [phone]  Initial call taken by: Judie Grieve,  September 18, 2009 10:06 AM    Prescriptions: FERROUS SULFATE 325 (65 FE) MG TABS (FERROUS SULFATE) 1 tab two times a day  #60 x 11   Entered by:   Danielle Rankin, CMA   Authorized by:   Gaylord Shih, MD, [facility]   Signed by:   Danielle Rankin, CMA on 09/18/2009   Method used:   [facility]ically to        [facility]  [address] [id number]       [address]       [place], [place]  [address]       Ph: [phone] or [phone]       Fax: [phone]   RxID:   [id number]

## 2010-03-06 NOTE — Medication Information (Signed)
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 yes       Details: see meds  Recent/future dental: no  Any missed doses?: no       Is patient compliant with meds? yes      Comments: c/o cough after starting lisinopril - now off still cough...maybe not med related???  Current Medications (verified): 1)  Warfarin Sodium 5 Mg Tabs (Warfarin Sodium) .... Take As Directed By Coumadin Clinic. 2)  Ferrous Sulfate 325 (65 Fe) Mg Tabs (Ferrous Sulfate) .Marland Kitchen.. 1 Tab Two Times A Day 3)  Furosemide 40 Mg Tabs (Furosemide) .... Take 1/2 Tablet Each Day. 4)  Metformin Hcl 500 Mg Tabs (Metformin Hcl) .... Take 1 Tab If Glucose Readings Are From 100-125 ....take 1 1/2 Tab If Readings 126-160...take 2 Tab If Reading Is Over 160 5)  Aspirin 81 Mg Tbec (Aspirin) .... Take One Tablet By Mouth Daily 6)  Crestor 40 Mg Tabs (Rosuvastatin Calcium) .Marland Kitchen.. 1 Tab Once Daily 7)  Miralax  Powd (Polyethylene Glycol 3350) .... Use As Directed 8)  Metamucil 30.9 % Powd (Psyllium) .... As Needed 9)  Losartan Potassium 25 Mg Tabs (Losartan Potassium) .... Take 1 Tablet Daily 10)  Viagra 50 Mg Tabs (Sildenafil Citrate) .... Take 1 Tablet As Needed 11)  Zyrtec Allergy 10 Mg Tabs (Cetirizine Hcl) .... Once Daily Prn  Allergies (verified): 1)  ! Heparin  Anticoagulation Management History:      The patient is taking warfarin and comes in today for a routine follow up visit.  Positive risk factors for bleeding include an age of 41 years  or older and presence of serious comorbidities.  The bleeding index is 'intermediate risk'.  Positive CHADS2 values include History of CHF and History of Diabetes.  Negative CHADS2 values include Age > 38 years old.  Anticoagulation responsible Sandeep Delagarza: Eden Emms MD, Theron Arista.  INR POC: 1.9.  Cuvette Lot#: [id number].  Exp: 01/2011.    Anticoagulation Management Assessment/Plan:      The patient's current anticoagulation dose is Warfarin sodium 5 mg tabs: Take as directed by coumadin clinic..  The target INR is 2.0-3.0.  The next INR is due 01/01/2010.  Anticoagulation instructions were given to patient.  Results were reviewed/authorized by Leota Sauers, PharmD, BCPS, CPP.         Prior Anticoagulation Instructions: INR 1.6 Today take 5mg s then change dose to 5mg s everyday except 2.5mg s on Mondays and Fridays. Recheck in 2 weeks.   Current Anticoagulation Instructions: INR 1.9  eat greens 2 times a week  Coumadin 5mg  tab, take 1.5tab today Wed 11/9 then 1 tab each day except 1/2 tab on Mondays

## 2010-03-06 NOTE — Letter (Signed)
[facility]   Imported By: Marylou Mccoy 11/12/2009 09:50:41  _____________________________________________________________________  External Attachment:    Type:   Image     Comment:   External Document

## 2010-03-06 NOTE — Medication Information (Signed)
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 no    Recent/future dental: no  Any missed doses?: no       Is patient compliant with meds? yes       Allergies: 1)  ! Heparin  Anticoagulation Management History:      The patient is taking warfarin and comes in today for a routine follow up visit.  Positive risk factors for bleeding include an age of 6 years or older and presence of serious comorbidities.  The bleeding index is 'intermediate risk'.  Positive CHADS2 values include History of CHF and History of Diabetes.  Negative CHADS2 values include Age > 47 years old.  Anticoagulation responsible provider: Eden Emms MD, Theron Arista.  INR POC: 1.5.  Cuvette Lot#: [id number].  Exp: 11/2010.    Anticoagulation Management Assessment/Plan:      The patient's current anticoagulation dose is Warfarin sodium 5 mg tabs: Take as directed by coumadin clinic..  The target INR is 2.0-3.0.  The next INR is due 10/08/2009.  Anticoagulation instructions were given to patient.  Results were reviewed/authorized by Cloyde Reams, RN, BSN.  He was notified by Cloyde Reams RN.         Prior Anticoagulation Instructions: INR 3.1  Hold Coumadin today. Then continue Coumadin 1 tab (5 mg) on Sun, Tues, Thur, Sat and Coumadin 0.5 tab (2.5 mg). Return to clinic in 2 weeks.    Current Anticoagulation Instructions: INR 1.5  Take 1.5 tablets today, then take 1 tablet tomorrow, then resume same dosage 1 tablet daily except 1/2 tablet on  Mondays, Wednesdays, and Fridays.  Recheck in 2 weeks.
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
## 2010-03-06 NOTE — Progress Notes (Signed)
Summary: rx amiodarone, crestor   Phone Note Refill Request Message from:  Patient on October 02, 2009 8:46 AM  Refills Requested: Medication #1:  AMIODARONE HCL 400 MG TABS 1 tablet daily  Medication #2:  CRESTOR 40 MG TABS 1 tab once daily [phone]  Initial call taken by: Judie Grieve,  October 02, 2009 8:46 AM    Prescriptions: CRESTOR 40 MG TABS (ROSUVASTATIN CALCIUM) 1 tab once daily  #30 x 11   Entered by:   Danielle Rankin, CMA   Authorized by:   Gaylord Shih, MD, [facility]   Signed by:   Danielle Rankin, CMA on 10/02/2009   Method used:   [facility]  [address] [facility]       [address]       [place], [place]  [address]       Ph: [phone] or [phone]       Fax: [phone]   RxID:   [id number] AMIODARONE HCL 400 MG TABS (AMIODARONE HCL) 1 tablet daily  #30 x 11   Entered by:   Danielle Rankin, CMA   Authorized by:   Gaylord Shih, MD, [facility]   Signed by:   Danielle Rankin, CMA on 10/02/2009   Method used:   [facility]  [address] [facility]       [address]       [place], [place]  [address]       Ph: [phone] or [phone]       Fax: [phone]   RxID:   [id number]

## 2010-03-06 NOTE — Medication Information (Signed)
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 no    Recent/future dental: no  Any missed doses?: no       Is patient compliant with meds? yes       Allergies: 1)  ! Heparin  Anticoagulation Management History:      The patient is taking warfarin and comes in today for a routine follow up visit.  Positive risk factors for bleeding include an age of 70 years or older and presence of serious comorbidities.  The bleeding index is 'intermediate risk'.  Positive CHADS2 values include History of CHF and History of Diabetes.  Negative CHADS2 values include Age > 94 years old.  Anticoagulation responsible provider: Annaelle Kasel MD, Reuel Boom.  INR POC: 2.4.  Cuvette Lot#: [id number].  Exp: 01/2011.    Anticoagulation Management Assessment/Plan:      The patient's current anticoagulation dose is Warfarin sodium 5 mg tabs: Take as directed by coumadin clinic..  The target INR is 2.0-3.0.  The next INR is due 01/22/2010.  Anticoagulation instructions were given to patient.  Results were reviewed/authorized by Reina Fuse, PharmD.  He was notified by Reina Fuse PharmD.         Prior Anticoagulation Instructions: INR 1.9  eat greens 2 times a week  Coumadin 5mg  tab, take 1.5tab today Wed 11/9 then 1 tab each day except 1/2 tab on Mondays  Current Anticoagulation Instructions: INR 2.4  Continue taking Coumadin 1 tab (5 mg) every day except for Coumadin 0.5 tab (2.5 mg) on Mondays. Return to clinic in 3 weeks.
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
## 2010-03-06 NOTE — Progress Notes (Signed)
Summary: Sweating  pt said he is hot alot  Phone Note Call from Patient Call back at Home Phone [phone] Call back at [phone]   Caller: Patient Summary of Call: Pt getting hot at times and is sweating alot Initial call taken by: Judie Grieve,  September 20, 2009 9:34 AM  Follow-up for Phone Call        lmtcb Scherrie Bateman, LPN  September 20, 2009 11:45 AM  Additional Follow-up for Phone Call Additional follow up Details #1::        not enough info to offer advice. Arrange office visit this week if needs to be seen. Additional Follow-up by: Gaylord Shih, MD, [facility],  September 23, 2009 10:45 AM     Appended Document: Sweating  pt said he is hot alot 09/23/09--1200noon--called and LM for pt to call back--nt  Appended Document: Sweating  pt said he is hot alot Pt. called back. He was seen by his PCP last week. Pt. had low blood sugar. Pt. was referred to a diabetic specialist.  Appended Document: Sweating  pt said he is hot alot ok.

## 2010-03-06 NOTE — Progress Notes (Signed)
Summary: rtning a call to nancy  Phone Note Call from Patient Call back at [phone]   Caller: Patient Reason for Call: Talk to Nurse, Talk to Doctor Summary of Call: rtning a call to nancy Initial call taken by: Omer Jack,  September 23, 2009 12:51 PM  Follow-up for Phone Call        spoke with pt. Patient states he was sweating a lot and getting hot at times. Pt. states he went to see his PCP last Friday and  foud out he had low blood sugar. Pt. has a history of diabetes. PCP   referred pt. to a diabetic specialist. Pt has no C/O at this time. Follow-up by: Ollen Gross, RN, BSN,  September 23, 2009 1:39 PM  Additional Follow-up for Phone Call Additional follow up Details #1::        ok. Additional Follow-up by: Gaylord Shih, MD, [facility],  September 23, 2009 2:25 PM

## 2010-03-06 NOTE — Op Note (Signed)
Summary: [facility]   Imported By: Roderic Ovens 08/19/2009 09:43:48  _____________________________________________________________________  External Attachment:    Type:   Image     Comment:   External Document

## 2010-03-06 NOTE — Miscellaneous (Signed)
Summary: Orders Update  Clinical Lists Changes  Problems: Added new problem of PHLEBITIS AND THROMBOPHLEBITIS OF FEMORAL VEIN (ICD-451.11) Orders: Added new Test order of Venous Duplex Lower Extremity (Venous Duplex Lower) - Signed

## 2010-03-06 NOTE — Progress Notes (Signed)
  Walk in Patient Form Recieved " Pt needs Medicine for Cough" sent to Message Nurse [facility]  December 11, 2009 10:57 AM

## 2010-03-06 NOTE — Medication Information (Signed)
Summary: rov/ewj  Anticoagulant Therapy  Managed by: Weston Brass, PharmD Referring MD: Dr Daleen Squibb PCP: Dr Kellie Shropshire Supervising MD: Riley Kill MD, Maisie Fus Indication 1: DVT (451.1) Indication 2: Pulmonary Embolism (415.1) Valve Type: atrial fibrillation (427.31) Lab Used: [facility] [place] Site: [address] INR POC 3.3 INR RANGE 2.0-3.0  Dietary changes: no    Health status changes: no    Bleeding/hemorrhagic complications: no    Recent/future hospitalizations: no    Any changes in medication regimen? no    Recent/future dental: no  Any missed doses?: no       Is patient compliant with meds? yes       Anticoagulation Management History:      The patient is taking warfarin and comes in today for a routine follow up visit.  Positive risk factors for bleeding include an age of 70 years or older.  The bleeding index is 'intermediate risk'.  Negative CHADS2 values include Age > 23 years old.  Anticoagulation responsible provider: Riley Kill MD, Maisie Fus.  INR POC: 3.3.  Cuvette Lot#: [id number].  Exp: 11/2010.    Anticoagulation Management Assessment/Plan:      The patient's current anticoagulation dose is Warfarin sodium 5 mg tabs: Take as directed by coumadin clinic..  The target INR is 2.0-3.0.  The next INR is due 09/02/2009.  Anticoagulation instructions were given to patient.  Results were reviewed/authorized by Weston Brass, PharmD.  He was notified by Weston Brass PharmD.         Prior Anticoagulation Instructions: INR 3.0  Start taking 1 tablet daily except 1/2 tablet on Mondays and Fridays.  Recheck in 1 week.   Current Anticoagulation Instructions: INR 3.3  Skip today's dose of Coumadin then decrease dose to 1 tablet every day except 1/2 tablet on Monday, Wednesday and Friday.

## 2010-03-06 NOTE — Medication Information (Signed)
Summary: rov/sp  Anticoagulant Therapy  Managed by: Bethena Midget, RN, BSN Referring MD: Dr Daleen Squibb PCP: Dr Kellie Shropshire Supervising MD: Ladona Ridgel MD, Sharlot Gowda Indication 1: DVT (451.1) Indication 2: Pulmonary Embolism (415.1) Valve Type: atrial fibrillation (427.31) Lab Used: [facility] [place] Site: [address] INR POC 1.6 INR RANGE 2.0-3.0  Dietary changes: no    Health status changes: no    Bleeding/hemorrhagic complications: no    Recent/future hospitalizations: no    Any changes in medication regimen? yes       Details: Amiodarone discontinued today by Dr Daleen Squibb and Lisinopril added  Recent/future dental: no  Any missed doses?: no       Is patient compliant with meds? yes       Allergies: 1)  ! Heparin  Anticoagulation Management History:      The patient is taking warfarin and comes in today for a routine follow up visit.  Positive risk factors for bleeding include an age of 70 years or older and presence of serious comorbidities.  The bleeding index is 'intermediate risk'.  Positive CHADS2 values include History of CHF and History of Diabetes.  Negative CHADS2 values include Age > 58 years old.  Anticoagulation responsible provider: Ladona Ridgel MD, Sharlot Gowda.  INR POC: 1.6.  Cuvette Lot#: [id number].  Exp: 01/2011.    Anticoagulation Management Assessment/Plan:      The patient's current anticoagulation dose is Warfarin sodium 5 mg tabs: Take as directed by coumadin clinic..  The target INR is 2.0-3.0.  The next INR is due 12/11/2009.  Anticoagulation instructions were given to patient.  Results were reviewed/authorized by Bethena Midget, RN, BSN.  He was notified by Bethena Midget, RN, BSN.         Prior Anticoagulation Instructions: INR 2.2  Continue to take 1 tablet everyday except for 1/2 tablet on Monday, Wednesday, and Friday. Recheck in 4 weeks.   Current Anticoagulation Instructions: INR 1.6 Today take 5mg s then change dose to 5mg s everyday except 2.5mg s on  Mondays and Fridays. Recheck in 2 weeks.

## 2010-03-10 ENCOUNTER — Encounter: Payer: Self-pay | Admitting: Cardiology

## 2010-03-11 ENCOUNTER — Encounter (INDEPENDENT_AMBULATORY_CARE_PROVIDER_SITE_OTHER): Payer: Self-pay | Admitting: *Deleted

## 2010-03-11 ENCOUNTER — Encounter (INDEPENDENT_AMBULATORY_CARE_PROVIDER_SITE_OTHER): Payer: Medicare Other

## 2010-03-11 ENCOUNTER — Other Ambulatory Visit: Payer: Self-pay

## 2010-03-11 ENCOUNTER — Other Ambulatory Visit (INDEPENDENT_AMBULATORY_CARE_PROVIDER_SITE_OTHER): Payer: Medicare Other

## 2010-03-11 DIAGNOSIS — E663 Overweight: Secondary | ICD-10-CM

## 2010-03-11 DIAGNOSIS — I10 Essential (primary) hypertension: Secondary | ICD-10-CM

## 2010-03-11 DIAGNOSIS — I2510 Atherosclerotic heart disease of native coronary artery without angina pectoris: Secondary | ICD-10-CM

## 2010-03-11 LAB — BASIC METABOLIC PANEL
Calcium: 8.9 mg/dL (ref 8.4–10.5)
Creatinine, Ser: 1 mg/dL (ref 0.4–1.5)
GFR: 95.11 mL/min (ref >60.00)
Sodium: 140 mEq/L (ref 135–145)

## 2010-03-12 ENCOUNTER — Other Ambulatory Visit: Payer: Self-pay

## 2010-03-20 NOTE — Medication Information (Signed)
Summary: Coumadin Clinic  Anticoagulant Therapy  Managed by: Inactive Referring MD: Dr Daleen Squibb PCP: Dr Kellie Shropshire Supervising MD: Graciela Husbands MD,Steven Indication 1: DVT (451.1) Indication 2: Pulmonary Embolism (415.1) Valve Type: atrial fibrillation (427.31) Lab Used: [facility] [place] Site: [address] INR RANGE 2.0-3.0          Comments: Okay to stop Coumadin per Dr. Daleen Squibb.  No clot on doppler.   Allergies: 1)  ! Heparin  Anticoagulation Management History:      Positive risk factors for bleeding include an age of 70 years or older and presence of serious comorbidities.  The bleeding index is 'intermediate risk'.  Positive CHADS2 values include History of CHF, History of HTN, and History of Diabetes.  Negative CHADS2 values include Age > 29 years old.  His last INR was 1.9.  Anticoagulation responsible provider: Graciela Husbands MD,Steven.  Exp: 01/2011.    Anticoagulation Management Assessment/Plan:      The patient's current anticoagulation dose is Warfarin sodium 5 mg tabs: Take as directed by coumadin clinic..  The target INR is 2.0-3.0.  The next INR is due 03/25/2010.  Anticoagulation instructions were given to patient.  Results were reviewed/authorized by Inactive.         Prior Anticoagulation Instructions: INR 1.9 (goal 2-3)  Continue take 1 tablet everyday except take 1/2 tablet on Mondays. Return for next INR check in 4 weeks.

## 2010-03-20 NOTE — Assessment & Plan Note (Signed)
Summary: Pt started on Losartan 1/24  also scheduled for a bmet today  Nurse Visit   Vital Signs:  Patient profile:   70 year old male Pulse (ortho):   70 / minute BP supine:   130 / 70  (right arm) BP sitting:   130 / 70  (left arm) Cuff size:   large  Primary Provider:  Dr Kellie Shropshire   History of Present Illness: Patient had bmet today and BP check followup to addition of Losartan 50mg  every day on 02/25/2010. Patient states that he is feeling well with the new medication. Since Dr.Wall stopped his Coumadin he will now take ASA 325mg  every day per Dr.Wall. Dr.Wall spoke with Patrick Massey today and advised him to take a break every 2 hours with ambulation on long care rides. Will call patient back when his lab results are available.   Allergies: 1)  ! Heparin

## 2010-03-26 NOTE — Miscellaneous (Signed)
Summary: [facility]   [facility]   Imported By: Roderic Ovens 03/19/2010 14:12:12  _____________________________________________________________________  External Attachment:    Type:   Image     Comment:   External Document

## 2010-04-10 ENCOUNTER — Encounter: Payer: Self-pay | Admitting: Cardiology

## 2010-04-10 DIAGNOSIS — I82409 Acute embolism and thrombosis of unspecified deep veins of unspecified lower extremity: Secondary | ICD-10-CM

## 2010-04-10 DIAGNOSIS — I4891 Unspecified atrial fibrillation: Secondary | ICD-10-CM

## 2010-04-10 DIAGNOSIS — I2699 Other pulmonary embolism without acute cor pulmonale: Secondary | ICD-10-CM

## 2010-04-16 LAB — GLUCOSE, CAPILLARY: Glucose-Capillary: 102 mg/dL — ABNORMAL HIGH (ref 70–99)

## 2010-04-17 LAB — GLUCOSE, CAPILLARY
Glucose-Capillary: 102 mg/dL — ABNORMAL HIGH (ref 70–99)
Glucose-Capillary: 117 mg/dL — ABNORMAL HIGH (ref 70–99)
Glucose-Capillary: 133 mg/dL — ABNORMAL HIGH (ref 70–99)
Glucose-Capillary: 135 mg/dL — ABNORMAL HIGH (ref 70–99)
Glucose-Capillary: 140 mg/dL — ABNORMAL HIGH (ref 70–99)
Glucose-Capillary: 94 mg/dL (ref 70–99)
Glucose-Capillary: 95 mg/dL (ref 70–99)
Glucose-Capillary: 96 mg/dL (ref 70–99)

## 2010-04-18 LAB — GLUCOSE, CAPILLARY
Glucose-Capillary: 100 mg/dL — ABNORMAL HIGH (ref 70–99)
Glucose-Capillary: 101 mg/dL — ABNORMAL HIGH (ref 70–99)
Glucose-Capillary: 106 mg/dL — ABNORMAL HIGH (ref 70–99)
Glucose-Capillary: 112 mg/dL — ABNORMAL HIGH (ref 70–99)
Glucose-Capillary: 44 mg/dL — CL (ref 70–99)
Glucose-Capillary: 71 mg/dL (ref 70–99)
Glucose-Capillary: 77 mg/dL (ref 70–99)
Glucose-Capillary: 127 mg/dL — ABNORMAL HIGH (ref 70–99)
Glucose-Capillary: 135 mg/dL — ABNORMAL HIGH (ref 70–99)

## 2010-04-19 LAB — CBC
HCT: 32.2 % — ABNORMAL LOW (ref 39.0–52.0)
Hemoglobin: 9.7 g/dL — ABNORMAL LOW (ref 13.0–17.0)
MCH: 26.5 pg (ref 26.0–34.0)
MCH: 26.5 pg (ref 26.0–34.0)
MCHC: 31.8 g/dL (ref 30.0–36.0)
MCV: 82.9 fL (ref 78.0–100.0)
MCV: 83.5 fL (ref 78.0–100.0)
Platelets: 150 10*3/uL (ref 150–400)
RBC: 3.65 MIL/uL — ABNORMAL LOW (ref 4.22–5.81)
RDW: 15 % (ref 11.5–15.5)
WBC: 7.6 10*3/uL (ref 4.0–10.5)

## 2010-04-19 LAB — DIFFERENTIAL
Basophils Relative: 0 % (ref 0–1)
Eosinophils Absolute: 0.2 10*3/uL (ref 0.0–0.7)
Eosinophils Relative: 3 % (ref 0–5)
Lymphs Abs: 1.9 10*3/uL (ref 0.7–4.0)
Monocytes Relative: 9 % (ref 3–12)
Neutrophils Relative %: 63 % (ref 43–77)

## 2010-04-19 LAB — BASIC METABOLIC PANEL
BUN: 13 mg/dL (ref 6–23)
CO2: 26 mEq/L (ref 19–32)
CO2: 27 mEq/L (ref 19–32)
Calcium: 8.6 mg/dL (ref 8.4–10.5)
Chloride: 102 mEq/L (ref 96–112)
Chloride: 102 mEq/L (ref 96–112)
Creatinine, Ser: 1.15 mg/dL (ref 0.4–1.5)
Creatinine, Ser: 1.15 mg/dL (ref 0.4–1.5)
GFR calc Af Amer: >60 mL/min (ref >60)
Glucose, Bld: 109 mg/dL — ABNORMAL HIGH (ref 70–99)
Glucose, Bld: 124 mg/dL — ABNORMAL HIGH (ref 70–99)
Potassium: 3.8 mEq/L (ref 3.5–5.1)
Sodium: 137 mEq/L (ref 135–145)

## 2010-04-19 LAB — APTT: aPTT: 72 seconds — ABNORMAL HIGH (ref 24–37)

## 2010-04-19 LAB — GLUCOSE, CAPILLARY
Glucose-Capillary: 124 mg/dL — ABNORMAL HIGH (ref 70–99)
Glucose-Capillary: 131 mg/dL — ABNORMAL HIGH (ref 70–99)
Glucose-Capillary: 163 mg/dL — ABNORMAL HIGH (ref 70–99)
Glucose-Capillary: 167 mg/dL — ABNORMAL HIGH (ref 70–99)

## 2010-04-19 LAB — PHOSPHORUS: Phosphorus: 4.5 mg/dL (ref 2.3–4.6)

## 2010-04-19 LAB — PROTIME-INR: Prothrombin Time: 25.1 seconds — ABNORMAL HIGH (ref 11.6–15.2)

## 2010-04-20 LAB — DIFFERENTIAL
Basophils Absolute: 0 10*3/uL (ref 0.0–0.1)
Basophils Absolute: 0 10*3/uL (ref 0.0–0.1)
Basophils Relative: 0 % (ref 0–1)
Eosinophils Absolute: 0.2 10*3/uL (ref 0.0–0.7)
Eosinophils Relative: 3 % (ref 0–5)
Eosinophils Relative: 3 % (ref 0–5)
Eosinophils Relative: 3 % (ref 0–5)
Lymphocytes Relative: 14 % (ref 12–46)
Lymphocytes Relative: 20 % (ref 12–46)
Lymphs Abs: 1.1 10*3/uL (ref 0.7–4.0)
Lymphs Abs: 1.7 10*3/uL (ref 0.7–4.0)
Monocytes Absolute: 0.6 10*3/uL (ref 0.1–1.0)
Monocytes Absolute: 1 10*3/uL (ref 0.1–1.0)
Monocytes Relative: 11 % (ref 3–12)
Monocytes Relative: 8 % (ref 3–12)
Neutro Abs: 5.8 10*3/uL (ref 1.7–7.7)
Neutro Abs: 6 10*3/uL (ref 1.7–7.7)

## 2010-04-20 LAB — CBC
HCT: 29 % — ABNORMAL LOW (ref 39.0–52.0)
HCT: 29.5 % — ABNORMAL LOW (ref 39.0–52.0)
HCT: 29.5 % — ABNORMAL LOW (ref 39.0–52.0)
HCT: 31.1 % — ABNORMAL LOW (ref 39.0–52.0)
HCT: 31.6 % — ABNORMAL LOW (ref 39.0–52.0)
HCT: 25.9 % — ABNORMAL LOW (ref 39.0–52.0)
HCT: 26.3 % — ABNORMAL LOW (ref 39.0–52.0)
HCT: 26.5 % — ABNORMAL LOW (ref 39.0–52.0)
HCT: 27 % — ABNORMAL LOW (ref 39.0–52.0)
HCT: 27.1 % — ABNORMAL LOW (ref 39.0–52.0)
HCT: 29.4 % — ABNORMAL LOW (ref 39.0–52.0)
HCT: 29.5 % — ABNORMAL LOW (ref 39.0–52.0)
HCT: 34 % — ABNORMAL LOW (ref 39.0–52.0)
HCT: 34.6 % — ABNORMAL LOW (ref 39.0–52.0)
Hemoglobin: 10 g/dL — ABNORMAL LOW (ref 13.0–17.0)
Hemoglobin: 10.3 g/dL — ABNORMAL LOW (ref 13.0–17.0)
Hemoglobin: 9.4 g/dL — ABNORMAL LOW (ref 13.0–17.0)
Hemoglobin: 9.4 g/dL — ABNORMAL LOW (ref 13.0–17.0)
Hemoglobin: 9.6 g/dL — ABNORMAL LOW (ref 13.0–17.0)
Hemoglobin: 9.6 g/dL — ABNORMAL LOW (ref 13.0–17.0)
Hemoglobin: 11.5 g/dL — ABNORMAL LOW (ref 13.0–17.0)
Hemoglobin: 11.5 g/dL — ABNORMAL LOW (ref 13.0–17.0)
Hemoglobin: 8.5 g/dL — ABNORMAL LOW (ref 13.0–17.0)
Hemoglobin: 8.6 g/dL — ABNORMAL LOW (ref 13.0–17.0)
Hemoglobin: 8.7 g/dL — ABNORMAL LOW (ref 13.0–17.0)
Hemoglobin: 8.8 g/dL — ABNORMAL LOW (ref 13.0–17.0)
Hemoglobin: 9 g/dL — ABNORMAL LOW (ref 13.0–17.0)
Hemoglobin: 9.5 g/dL — ABNORMAL LOW (ref 13.0–17.0)
Hemoglobin: 9.6 g/dL — ABNORMAL LOW (ref 13.0–17.0)
MCH: 26.4 pg (ref 26.0–34.0)
MCH: 27.4 pg (ref 26.0–34.0)
MCH: 27.5 pg (ref 26.0–34.0)
MCH: 27.5 pg (ref 26.0–34.0)
MCH: 27.7 pg (ref 26.0–34.0)
MCH: 27.8 pg (ref 26.0–34.0)
MCH: 27.9 pg (ref 26.0–34.0)
MCH: 28 pg (ref 26.0–34.0)
MCH: 28.2 pg (ref 26.0–34.0)
MCHC: 31.7 g/dL (ref 30.0–36.0)
MCHC: 32.3 g/dL (ref 30.0–36.0)
MCHC: 32.7 g/dL (ref 30.0–36.0)
MCHC: 32.4 g/dL (ref 30.0–36.0)
MCHC: 32.4 g/dL (ref 30.0–36.0)
MCHC: 32.5 g/dL (ref 30.0–36.0)
MCHC: 32.7 g/dL (ref 30.0–36.0)
MCHC: 32.8 g/dL (ref 30.0–36.0)
MCHC: 32.8 g/dL (ref 30.0–36.0)
MCHC: 32.8 g/dL (ref 30.0–36.0)
MCHC: 32.9 g/dL (ref 30.0–36.0)
MCHC: 33 g/dL (ref 30.0–36.0)
MCHC: 33.1 g/dL (ref 30.0–36.0)
MCHC: 33.3 g/dL (ref 30.0–36.0)
MCV: 82.8 fL (ref 78.0–100.0)
MCV: 82.9 fL (ref 78.0–100.0)
MCV: 83.9 fL (ref 78.0–100.0)
MCV: 84.4 fL (ref 78.0–100.0)
MCV: 84.2 fL (ref 78.0–100.0)
MCV: 84.5 fL (ref 78.0–100.0)
MCV: 84.7 fL (ref 78.0–100.0)
MCV: 84.8 fL (ref 78.0–100.0)
MCV: 84.9 fL (ref 78.0–100.0)
MCV: 84.9 fL (ref 78.0–100.0)
MCV: 85.1 fL (ref 78.0–100.0)
MCV: 85.3 fL (ref 78.0–100.0)
MCV: 85.6 fL (ref 78.0–100.0)
MCV: 85.7 fL (ref 78.0–100.0)
Platelets: 125 10*3/uL — ABNORMAL LOW (ref 150–400)
Platelets: 125 10*3/uL — ABNORMAL LOW (ref 150–400)
Platelets: 134 10*3/uL — ABNORMAL LOW (ref 150–400)
Platelets: 142 10*3/uL — ABNORMAL LOW (ref 150–400)
Platelets: 70 10*3/uL — ABNORMAL LOW (ref 150–400)
Platelets: 95 10*3/uL — ABNORMAL LOW (ref 150–400)
Platelets: 111 10*3/uL — ABNORMAL LOW (ref 150–400)
Platelets: 117 10*3/uL — ABNORMAL LOW (ref 150–400)
Platelets: 119 10*3/uL — ABNORMAL LOW (ref 150–400)
Platelets: 131 10*3/uL — ABNORMAL LOW (ref 150–400)
Platelets: 136 10*3/uL — ABNORMAL LOW (ref 150–400)
Platelets: 141 10*3/uL — ABNORMAL LOW (ref 150–400)
Platelets: 163 10*3/uL (ref 150–400)
Platelets: 165 10*3/uL (ref 150–400)
Platelets: 170 10*3/uL (ref 150–400)
RBC: 3.28 MIL/uL — ABNORMAL LOW (ref 4.22–5.81)
RBC: 3.5 MIL/uL — ABNORMAL LOW (ref 4.22–5.81)
RBC: 3.5 MIL/uL — ABNORMAL LOW (ref 4.22–5.81)
RBC: 3.51 MIL/uL — ABNORMAL LOW (ref 4.22–5.81)
RBC: 3.63 MIL/uL — ABNORMAL LOW (ref 4.22–5.81)
RBC: 3.64 MIL/uL — ABNORMAL LOW (ref 4.22–5.81)
RBC: 3.79 MIL/uL — ABNORMAL LOW (ref 4.22–5.81)
RBC: 3.81 MIL/uL — ABNORMAL LOW (ref 4.22–5.81)
RBC: 3.07 MIL/uL — ABNORMAL LOW (ref 4.22–5.81)
RBC: 3.09 MIL/uL — ABNORMAL LOW (ref 4.22–5.81)
RBC: 3.1 MIL/uL — ABNORMAL LOW (ref 4.22–5.81)
RBC: 3.18 MIL/uL — ABNORMAL LOW (ref 4.22–5.81)
RBC: 3.2 MIL/uL — ABNORMAL LOW (ref 4.22–5.81)
RBC: 3.44 MIL/uL — ABNORMAL LOW (ref 4.22–5.81)
RBC: 3.48 MIL/uL — ABNORMAL LOW (ref 4.22–5.81)
RBC: 4.03 MIL/uL — ABNORMAL LOW (ref 4.22–5.81)
RBC: 4.09 MIL/uL — ABNORMAL LOW (ref 4.22–5.81)
RBC: 4.13 MIL/uL — ABNORMAL LOW (ref 4.22–5.81)
RDW: 13.9 % (ref 11.5–15.5)
RDW: 13.9 % (ref 11.5–15.5)
RDW: 14.1 % (ref 11.5–15.5)
RDW: 13.2 % (ref 11.5–15.5)
RDW: 13.3 % (ref 11.5–15.5)
RDW: 13.5 % (ref 11.5–15.5)
RDW: 13.6 % (ref 11.5–15.5)
RDW: 13.7 % (ref 11.5–15.5)
RDW: 13.7 % (ref 11.5–15.5)
RDW: 13.9 % (ref 11.5–15.5)
RDW: 13.9 % (ref 11.5–15.5)
RDW: 14.1 % (ref 11.5–15.5)
WBC: 7.5 10*3/uL (ref 4.0–10.5)
WBC: 7.8 10*3/uL (ref 4.0–10.5)
WBC: 8 10*3/uL (ref 4.0–10.5)
WBC: 8 10*3/uL (ref 4.0–10.5)
WBC: 8.1 10*3/uL (ref 4.0–10.5)
WBC: 8.1 10*3/uL (ref 4.0–10.5)
WBC: 8.3 10*3/uL (ref 4.0–10.5)
WBC: 8.3 10*3/uL (ref 4.0–10.5)
WBC: 8.8 10*3/uL (ref 4.0–10.5)
WBC: 9.1 10*3/uL (ref 4.0–10.5)
WBC: 10 10*3/uL (ref 4.0–10.5)
WBC: 10.2 10*3/uL (ref 4.0–10.5)
WBC: 10.4 10*3/uL (ref 4.0–10.5)
WBC: 10.4 10*3/uL (ref 4.0–10.5)
WBC: 10.9 10*3/uL — ABNORMAL HIGH (ref 4.0–10.5)
WBC: 11.9 10*3/uL — ABNORMAL HIGH (ref 4.0–10.5)
WBC: 8.4 10*3/uL (ref 4.0–10.5)
WBC: 9.4 10*3/uL (ref 4.0–10.5)

## 2010-04-20 LAB — COMPREHENSIVE METABOLIC PANEL
ALT: 29 U/L (ref 0–53)
Albumin: 3.3 g/dL — ABNORMAL LOW (ref 3.5–5.2)
Alkaline Phosphatase: 73 U/L (ref 39–117)
CO2: 24 mEq/L (ref 19–32)
Calcium: 8.8 mg/dL (ref 8.4–10.5)
Creatinine, Ser: 1.15 mg/dL (ref 0.4–1.5)
GFR calc Af Amer: >60 mL/min (ref >60)
GFR calc non Af Amer: >60 mL/min (ref >60)
Glucose, Bld: 142 mg/dL — ABNORMAL HIGH (ref 70–99)
Potassium: 4.7 mEq/L (ref 3.5–5.1)
Sodium: 136 mEq/L (ref 135–145)
Sodium: 136 mEq/L (ref 135–145)
Total Protein: 6.8 g/dL (ref 6.0–8.3)
Total Protein: 6.6 g/dL (ref 6.0–8.3)

## 2010-04-20 LAB — GLUCOSE, CAPILLARY
Glucose-Capillary: 106 mg/dL — ABNORMAL HIGH (ref 70–99)
Glucose-Capillary: 107 mg/dL — ABNORMAL HIGH (ref 70–99)
Glucose-Capillary: 120 mg/dL — ABNORMAL HIGH (ref 70–99)
Glucose-Capillary: 134 mg/dL — ABNORMAL HIGH (ref 70–99)
Glucose-Capillary: 146 mg/dL — ABNORMAL HIGH (ref 70–99)
Glucose-Capillary: 153 mg/dL — ABNORMAL HIGH (ref 70–99)
Glucose-Capillary: 158 mg/dL — ABNORMAL HIGH (ref 70–99)
Glucose-Capillary: 163 mg/dL — ABNORMAL HIGH (ref 70–99)
Glucose-Capillary: 165 mg/dL — ABNORMAL HIGH (ref 70–99)
Glucose-Capillary: 165 mg/dL — ABNORMAL HIGH (ref 70–99)
Glucose-Capillary: 166 mg/dL — ABNORMAL HIGH (ref 70–99)
Glucose-Capillary: 167 mg/dL — ABNORMAL HIGH (ref 70–99)
Glucose-Capillary: 167 mg/dL — ABNORMAL HIGH (ref 70–99)
Glucose-Capillary: 168 mg/dL — ABNORMAL HIGH (ref 70–99)
Glucose-Capillary: 170 mg/dL — ABNORMAL HIGH (ref 70–99)
Glucose-Capillary: 173 mg/dL — ABNORMAL HIGH (ref 70–99)
Glucose-Capillary: 179 mg/dL — ABNORMAL HIGH (ref 70–99)
Glucose-Capillary: 187 mg/dL — ABNORMAL HIGH (ref 70–99)
Glucose-Capillary: 191 mg/dL — ABNORMAL HIGH (ref 70–99)
Glucose-Capillary: 201 mg/dL — ABNORMAL HIGH (ref 70–99)
Glucose-Capillary: 124 mg/dL — ABNORMAL HIGH (ref 70–99)
Glucose-Capillary: 128 mg/dL — ABNORMAL HIGH (ref 70–99)
Glucose-Capillary: 128 mg/dL — ABNORMAL HIGH (ref 70–99)
Glucose-Capillary: 142 mg/dL — ABNORMAL HIGH (ref 70–99)
Glucose-Capillary: 147 mg/dL — ABNORMAL HIGH (ref 70–99)
Glucose-Capillary: 148 mg/dL — ABNORMAL HIGH (ref 70–99)
Glucose-Capillary: 150 mg/dL — ABNORMAL HIGH (ref 70–99)
Glucose-Capillary: 152 mg/dL — ABNORMAL HIGH (ref 70–99)
Glucose-Capillary: 158 mg/dL — ABNORMAL HIGH (ref 70–99)
Glucose-Capillary: 160 mg/dL — ABNORMAL HIGH (ref 70–99)
Glucose-Capillary: 163 mg/dL — ABNORMAL HIGH (ref 70–99)
Glucose-Capillary: 182 mg/dL — ABNORMAL HIGH (ref 70–99)
Glucose-Capillary: 186 mg/dL — ABNORMAL HIGH (ref 70–99)
Glucose-Capillary: 188 mg/dL — ABNORMAL HIGH (ref 70–99)
Glucose-Capillary: 188 mg/dL — ABNORMAL HIGH (ref 70–99)
Glucose-Capillary: 193 mg/dL — ABNORMAL HIGH (ref 70–99)
Glucose-Capillary: 194 mg/dL — ABNORMAL HIGH (ref 70–99)
Glucose-Capillary: 205 mg/dL — ABNORMAL HIGH (ref 70–99)
Glucose-Capillary: 206 mg/dL — ABNORMAL HIGH (ref 70–99)
Glucose-Capillary: 209 mg/dL — ABNORMAL HIGH (ref 70–99)
Glucose-Capillary: 77 mg/dL (ref 70–99)
Glucose-Capillary: 95 mg/dL (ref 70–99)

## 2010-04-20 LAB — BASIC METABOLIC PANEL
BUN: 17 mg/dL (ref 6–23)
BUN: 37 mg/dL — ABNORMAL HIGH (ref 6–23)
BUN: 10 mg/dL (ref 6–23)
BUN: 13 mg/dL (ref 6–23)
BUN: 13 mg/dL (ref 6–23)
BUN: 14 mg/dL (ref 6–23)
BUN: 16 mg/dL (ref 6–23)
CO2: 25 mEq/L (ref 19–32)
CO2: 25 mEq/L (ref 19–32)
CO2: 25 mEq/L (ref 19–32)
CO2: 26 mEq/L (ref 19–32)
CO2: 28 mEq/L (ref 19–32)
CO2: 28 mEq/L (ref 19–32)
CO2: 29 mEq/L (ref 19–32)
Calcium: 8.5 mg/dL (ref 8.4–10.5)
Calcium: 8.5 mg/dL (ref 8.4–10.5)
Calcium: 8.6 mg/dL (ref 8.4–10.5)
Calcium: 9 mg/dL (ref 8.4–10.5)
Calcium: 7.7 mg/dL — ABNORMAL LOW (ref 8.4–10.5)
Calcium: 8.3 mg/dL — ABNORMAL LOW (ref 8.4–10.5)
Calcium: 8.4 mg/dL (ref 8.4–10.5)
Calcium: 8.4 mg/dL (ref 8.4–10.5)
Calcium: 8.5 mg/dL (ref 8.4–10.5)
Chloride: 103 mEq/L (ref 96–112)
Chloride: 105 mEq/L (ref 96–112)
Chloride: 106 mEq/L (ref 96–112)
Chloride: 101 mEq/L (ref 96–112)
Chloride: 103 mEq/L (ref 96–112)
Chloride: 105 mEq/L (ref 96–112)
Chloride: 105 mEq/L (ref 96–112)
Chloride: 108 mEq/L (ref 96–112)
Creatinine, Ser: 0.91 mg/dL (ref 0.4–1.5)
Creatinine, Ser: 1.15 mg/dL (ref 0.4–1.5)
Creatinine, Ser: 1.17 mg/dL (ref 0.4–1.5)
Creatinine, Ser: 0.81 mg/dL (ref 0.4–1.5)
Creatinine, Ser: 0.88 mg/dL (ref 0.4–1.5)
Creatinine, Ser: 0.97 mg/dL (ref 0.4–1.5)
Creatinine, Ser: 1.07 mg/dL (ref 0.4–1.5)
Creatinine, Ser: 1.09 mg/dL (ref 0.4–1.5)
GFR calc Af Amer: >60 mL/min (ref >60)
GFR calc Af Amer: >60 mL/min (ref >60)
GFR calc Af Amer: >60 mL/min (ref >60)
GFR calc Af Amer: >60 mL/min (ref >60)
GFR calc Af Amer: >60 mL/min (ref >60)
GFR calc Af Amer: >60 mL/min (ref >60)
GFR calc Af Amer: >60 mL/min (ref >60)
GFR calc Af Amer: >60 mL/min (ref >60)
GFR calc Af Amer: >60 mL/min (ref >60)
GFR calc non Af Amer: >60 mL/min (ref >60)
GFR calc non Af Amer: >60 mL/min (ref >60)
GFR calc non Af Amer: >60 mL/min (ref >60)
GFR calc non Af Amer: >60 mL/min (ref >60)
GFR calc non Af Amer: >60 mL/min (ref >60)
GFR calc non Af Amer: >60 mL/min (ref >60)
GFR calc non Af Amer: >60 mL/min (ref >60)
GFR calc non Af Amer: >60 mL/min (ref >60)
GFR calc non Af Amer: >60 mL/min (ref >60)
GFR calc non Af Amer: >60 mL/min (ref >60)
GFR calc non Af Amer: >60 mL/min (ref >60)
Glucose, Bld: 138 mg/dL — ABNORMAL HIGH (ref 70–99)
Glucose, Bld: 152 mg/dL — ABNORMAL HIGH (ref 70–99)
Glucose, Bld: 109 mg/dL — ABNORMAL HIGH (ref 70–99)
Glucose, Bld: 133 mg/dL — ABNORMAL HIGH (ref 70–99)
Glucose, Bld: 152 mg/dL — ABNORMAL HIGH (ref 70–99)
Glucose, Bld: 154 mg/dL — ABNORMAL HIGH (ref 70–99)
Glucose, Bld: 161 mg/dL — ABNORMAL HIGH (ref 70–99)
Potassium: 4 mEq/L (ref 3.5–5.1)
Potassium: 4.1 mEq/L (ref 3.5–5.1)
Potassium: 4.5 mEq/L (ref 3.5–5.1)
Potassium: 4.6 mEq/L (ref 3.5–5.1)
Potassium: 5.2 mEq/L — ABNORMAL HIGH (ref 3.5–5.1)
Potassium: 4.1 mEq/L (ref 3.5–5.1)
Potassium: 4.3 mEq/L (ref 3.5–5.1)
Potassium: 4.3 mEq/L (ref 3.5–5.1)
Potassium: 4.4 mEq/L (ref 3.5–5.1)
Potassium: 4.7 mEq/L (ref 3.5–5.1)
Sodium: 132 mEq/L — ABNORMAL LOW (ref 135–145)
Sodium: 136 mEq/L (ref 135–145)
Sodium: 136 mEq/L (ref 135–145)
Sodium: 137 mEq/L (ref 135–145)
Sodium: 137 mEq/L (ref 135–145)
Sodium: 137 mEq/L (ref 135–145)
Sodium: 138 mEq/L (ref 135–145)
Sodium: 136 mEq/L (ref 135–145)
Sodium: 136 mEq/L (ref 135–145)
Sodium: 137 mEq/L (ref 135–145)
Sodium: 138 mEq/L (ref 135–145)
Sodium: 139 mEq/L (ref 135–145)

## 2010-04-20 LAB — SODIUM, URINE, RANDOM: Sodium, Ur: 74 mEq/L

## 2010-04-20 LAB — POCT I-STAT 3, ART BLOOD GAS (G3+)
Acid-base deficit: 2 mmol/L (ref 0.0–2.0)
Bicarbonate: 24.9 mEq/L — ABNORMAL HIGH (ref 20.0–24.0)
Bicarbonate: 26.6 mEq/L — ABNORMAL HIGH (ref 20.0–24.0)
Patient temperature: 35.9
Patient temperature: 36.5
TCO2: 28 mmol/L (ref 0–100)
pCO2 arterial: 41.9 mmHg (ref 35.0–45.0)
pH, Arterial: 7.331 — ABNORMAL LOW (ref 7.350–7.450)
pH, Arterial: 7.411 (ref 7.350–7.450)
pO2, Arterial: 366 mmHg — ABNORMAL HIGH (ref 80.0–100.0)

## 2010-04-20 LAB — BLOOD GAS, ARTERIAL
Acid-Base Excess: 1.6 mmol/L (ref 0.0–2.0)
Bicarbonate: 25.6 mEq/L — ABNORMAL HIGH (ref 20.0–24.0)
Drawn by: 29925
FIO2: 21 %
O2 Saturation: 95.1 %
Patient temperature: 98.6
TCO2: 26.8 mmol/L (ref 0–100)
pCO2 arterial: 39.8 mmHg (ref 35.0–45.0)
pH, Arterial: 7.424 (ref 7.350–7.450)
pO2, Arterial: 67 mmHg — ABNORMAL LOW (ref 80.0–100.0)

## 2010-04-20 LAB — URINALYSIS, MICROSCOPIC ONLY
Bilirubin Urine: NEGATIVE
Glucose, UA: NEGATIVE mg/dL
Hgb urine dipstick: NEGATIVE
Ketones, ur: NEGATIVE mg/dL
Nitrite: NEGATIVE
Protein, ur: NEGATIVE mg/dL
Specific Gravity, Urine: 1.007 (ref 1.005–1.030)
Urobilinogen, UA: 0.2 mg/dL (ref 0.0–1.0)
pH: 6 (ref 5.0–8.0)

## 2010-04-20 LAB — POCT I-STAT 4, (NA,K, GLUC, HGB,HCT)
Glucose, Bld: 104 mg/dL — ABNORMAL HIGH (ref 70–99)
Glucose, Bld: 143 mg/dL — ABNORMAL HIGH (ref 70–99)
HCT: 27 % — ABNORMAL LOW (ref 39.0–52.0)
HCT: 28 % — ABNORMAL LOW (ref 39.0–52.0)
HCT: 32 % — ABNORMAL LOW (ref 39.0–52.0)
Hemoglobin: 10.2 g/dL — ABNORMAL LOW (ref 13.0–17.0)
Hemoglobin: 10.9 g/dL — ABNORMAL LOW (ref 13.0–17.0)
Hemoglobin: 9.5 g/dL — ABNORMAL LOW (ref 13.0–17.0)
Hemoglobin: 9.9 g/dL — ABNORMAL LOW (ref 13.0–17.0)
Potassium: 3.2 mEq/L — ABNORMAL LOW (ref 3.5–5.1)
Potassium: 3.6 mEq/L (ref 3.5–5.1)
Potassium: 3.7 mEq/L (ref 3.5–5.1)
Potassium: 4.3 mEq/L (ref 3.5–5.1)
Sodium: 140 mEq/L (ref 135–145)
Sodium: 140 mEq/L (ref 135–145)

## 2010-04-20 LAB — CREATININE, SERUM
Creatinine, Ser: 0.82 mg/dL (ref 0.4–1.5)
Creatinine, Ser: 1.29 mg/dL (ref 0.4–1.5)
GFR calc Af Amer: >60 mL/min (ref >60)
GFR calc Af Amer: >60 mL/min (ref >60)
GFR calc non Af Amer: 55 mL/min — ABNORMAL LOW (ref >60)
GFR calc non Af Amer: >60 mL/min (ref >60)

## 2010-04-20 LAB — TYPE AND SCREEN
ABO/RH(D): O POS
Antibody Screen: NEGATIVE

## 2010-04-20 LAB — PROTIME-INR
INR: 1.15 (ref 0.00–1.49)
INR: 1.16 (ref 0.00–1.49)
INR: 1.24 (ref 0.00–1.49)
INR: 1.28 (ref 0.00–1.49)
INR: 1.28 (ref 0.00–1.49)
INR: 1.29 (ref 0.00–1.49)
INR: 1.31 (ref 0.00–1.49)
INR: 1.64 — ABNORMAL HIGH (ref 0.00–1.49)
INR: 1.26 (ref 0.00–1.49)
Prothrombin Time: 14.6 seconds (ref 11.6–15.2)
Prothrombin Time: 14.7 seconds (ref 11.6–15.2)
Prothrombin Time: 15.5 seconds — ABNORMAL HIGH (ref 11.6–15.2)
Prothrombin Time: 15.5 seconds — ABNORMAL HIGH (ref 11.6–15.2)
Prothrombin Time: 15.9 seconds — ABNORMAL HIGH (ref 11.6–15.2)
Prothrombin Time: 15.9 seconds — ABNORMAL HIGH (ref 11.6–15.2)
Prothrombin Time: 16.2 seconds — ABNORMAL HIGH (ref 11.6–15.2)
Prothrombin Time: 19.3 seconds — ABNORMAL HIGH (ref 11.6–15.2)
Prothrombin Time: 15.7 seconds — ABNORMAL HIGH (ref 11.6–15.2)

## 2010-04-20 LAB — CARDIAC PANEL(CRET KIN+CKTOT+MB+TROPI)
CK, MB: 3.6 ng/mL (ref 0.3–4.0)
Relative Index: INVALID (ref 0.0–2.5)
Relative Index: INVALID (ref 0.0–2.5)
Relative Index: INVALID (ref 0.0–2.5)
Total CK: 37 U/L (ref 7–232)
Total CK: 38 U/L (ref 7–232)
Total CK: 66 U/L (ref 7–232)
Total CK: 1568 U/L — ABNORMAL HIGH (ref 7–232)
Troponin I: 0.07 ng/mL — ABNORMAL HIGH (ref 0.00–0.06)
Troponin I: 0.09 ng/mL — ABNORMAL HIGH (ref 0.00–0.06)
Troponin I: 0.09 ng/mL — ABNORMAL HIGH (ref 0.00–0.06)
Troponin I: 23.24 ng/mL (ref 0.00–0.06)
Troponin I: 31.95 ng/mL (ref 0.00–0.06)

## 2010-04-20 LAB — POCT CARDIAC MARKERS
CKMB, poc: 1.4 ng/mL (ref 1.0–8.0)
Myoglobin, poc: 121 ng/mL (ref 12–200)
Myoglobin, poc: >500 ng/mL (ref 12–200)
Troponin i, poc: <0.05 ng/mL (ref 0.00–0.09)

## 2010-04-20 LAB — PLATELET INHIBITION P2Y12
P2Y12 % Inhibition: 16 %
Platelet Function  P2Y12: 286 [PRU] (ref 194–418)

## 2010-04-20 LAB — HEPARIN LEVEL (UNFRACTIONATED)
Heparin Unfractionated: 0.15 IU/mL — ABNORMAL LOW (ref 0.30–0.70)
Heparin Unfractionated: <0.1 IU/mL — ABNORMAL LOW (ref 0.30–0.70)
Heparin Unfractionated: 0.64 IU/mL (ref 0.30–0.70)
Heparin Unfractionated: 0.65 IU/mL (ref 0.30–0.70)
Heparin Unfractionated: 0.69 IU/mL (ref 0.30–0.70)

## 2010-04-20 LAB — TSH: TSH: 1.209 u[IU]/mL (ref 0.350–4.500)

## 2010-04-20 LAB — LACTATE DEHYDROGENASE: LDH: 213 U/L (ref 94–250)

## 2010-04-20 LAB — APTT
aPTT: 44 seconds — ABNORMAL HIGH (ref 24–37)
aPTT: 47 seconds — ABNORMAL HIGH (ref 24–37)
aPTT: 50 seconds — ABNORMAL HIGH (ref 24–37)
aPTT: 51 seconds — ABNORMAL HIGH (ref 24–37)
aPTT: 56 seconds — ABNORMAL HIGH (ref 24–37)
aPTT: 57 seconds — ABNORMAL HIGH (ref 24–37)
aPTT: 65 seconds — ABNORMAL HIGH (ref 24–37)
aPTT: 65 seconds — ABNORMAL HIGH (ref 24–37)
aPTT: 69 seconds — ABNORMAL HIGH (ref 24–37)
aPTT: 30 seconds (ref 24–37)
aPTT: 36 seconds (ref 24–37)

## 2010-04-20 LAB — HEPARIN INDUCED THROMBOCYTOPENIA PNL
Heparin Induced Plt Ab: POSITIVE
Patient O.D.: 2.063
UFH Low Dose 0.1 IU/mL: 100 % Release
UFH SRA Result: POSITIVE

## 2010-04-20 LAB — MAGNESIUM
Magnesium: 2.4 mg/dL (ref 1.5–2.5)
Magnesium: 2.4 mg/dL (ref 1.5–2.5)
Magnesium: 2.8 mg/dL — ABNORMAL HIGH (ref 1.5–2.5)

## 2010-04-20 LAB — DIC (DISSEMINATED INTRAVASCULAR COAGULATION)PANEL
D-Dimer, Quant: >20 ug/mL-FEU — ABNORMAL HIGH (ref 0.00–0.48)
INR: 1.13 (ref 0.00–1.49)
Prothrombin Time: 14.4 seconds (ref 11.6–15.2)
Smear Review: NONE SEEN

## 2010-04-20 LAB — POCT I-STAT, CHEM 8
BUN: 18 mg/dL (ref 6–23)
Calcium, Ion: 1.15 mmol/L (ref 1.12–1.32)
HCT: 28 % — ABNORMAL LOW (ref 39.0–52.0)
Hemoglobin: 9.5 g/dL — ABNORMAL LOW (ref 13.0–17.0)
Hemoglobin: 9.9 g/dL — ABNORMAL LOW (ref 13.0–17.0)
Sodium: 135 mEq/L (ref 135–145)
Sodium: 139 mEq/L (ref 135–145)
TCO2: 24 mmol/L (ref 0–100)
TCO2: 25 mmol/L (ref 0–100)

## 2010-04-20 LAB — RETICULOCYTES
RBC.: 3.84 MIL/uL — ABNORMAL LOW (ref 4.22–5.81)
Retic Count, Absolute: 92.2 10*3/uL (ref 19.0–186.0)

## 2010-04-20 LAB — CK TOTAL AND CKMB (NOT AT ARMC)
CK, MB: 66.4 ng/mL (ref 0.3–4.0)
Relative Index: 8 — ABNORMAL HIGH (ref 0.0–2.5)

## 2010-04-20 LAB — HEMOGLOBIN AND HEMATOCRIT, BLOOD
HCT: 25 % — ABNORMAL LOW (ref 39.0–52.0)
Hemoglobin: 8.3 g/dL — ABNORMAL LOW (ref 13.0–17.0)

## 2010-04-20 LAB — POCT I-STAT GLUCOSE: Operator id: 125961

## 2010-04-20 LAB — LIPID PANEL: HDL: 62 mg/dL (ref >39)

## 2010-04-20 LAB — PLATELET COUNT: Platelets: 132 10*3/uL — ABNORMAL LOW (ref 150–400)

## 2010-04-20 LAB — TROPONIN I: Troponin I: 1.92 ng/mL (ref 0.00–0.06)

## 2010-04-20 LAB — FERRITIN: Ferritin: 672 ng/mL — ABNORMAL HIGH (ref 22–322)

## 2010-04-20 LAB — BRAIN NATRIURETIC PEPTIDE
Pro B Natriuretic peptide (BNP): 1184 pg/mL — ABNORMAL HIGH (ref 0.0–100.0)
Pro B Natriuretic peptide (BNP): 976 pg/mL — ABNORMAL HIGH (ref 0.0–100.0)

## 2010-04-20 LAB — IRON AND TIBC
Iron: 28 ug/dL — ABNORMAL LOW (ref 42–135)
TIBC: 274 ug/dL (ref 215–435)

## 2010-04-20 LAB — HEMOGLOBIN A1C: Mean Plasma Glucose: 174 mg/dL — ABNORMAL HIGH (ref <117)

## 2010-04-20 LAB — CREATININE, URINE, RANDOM: Creatinine, Urine: 115.4 mg/dL

## 2010-06-02 ENCOUNTER — Telehealth: Payer: Self-pay | Admitting: Cardiology

## 2010-06-02 NOTE — Telephone Encounter (Signed)
forosimide 40mg  refill [facility] [address]

## 2010-06-17 NOTE — Assessment & Plan Note (Signed)
OFFICE VISIT   Patrick Massey, Patrick Massey  DOB:  May 29, 1940                                        December 19, 2009  CHART #:  [id number]   HISTORY:  The patient returns to the office today in followup after his  coronary artery bypass grafting done on July 24, 2009.  Postoperative  course was complicated by right lower limb DVT, the upset leg from the  vein harvest with evidence of pulmonary embolus.  He has continued on  Coumadin since that time and done well without any clinical evidence of  recurrent PE.  He has now finished cardiac rehab and in order of  aerobics program.  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  He notes that he has recently been seen Dr. Margaretmary Bayley and had his  diabetes under good control, always less than 140.  He has had no  recurrent angina or evidence of congestive heart failure.  He did note  that he was unable to take lisinopril because of persistent cough.   PHYSICAL EXAMINATION:  His blood pressure 135/83, pulse 66, respiratory  rate 16, and O2 sats 95%.  His sternum is stable and well healed.  Lungs  are clear.  He has no pedal edema.   CURRENT MEDICATIONS:  He is now off amiodarone; continues on Lasix,  metformin, Coumadin, aspirin, and Crestor 40 mg a day.   IMPRESSION:  Overall, I am very pleased with his progress.  I have not  made him a return appointment to see me, but would be glad to see him at  anytime at his or Dr. Vern Claude request.   Patrick Plane, MD  Electronically Signed   EG/MEDQ  D:  12/19/2009  T:  12/19/2009  Job:  [id number]   cc:   Thomas C. Wall, MD, [facility]  Lindaann Slough, M.D.
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
## 2010-07-25 ENCOUNTER — Telehealth: Payer: Self-pay | Admitting: Internal Medicine

## 2010-07-25 NOTE — Telephone Encounter (Signed)
Pt's feet are both swelling on the top's of his feet, has appt 6-28 but thinks he needs to be seen mon or tues

## 2010-07-28 NOTE — Telephone Encounter (Signed)
Left message for pt to call back back the edema in his feet.

## 2010-07-28 NOTE — Telephone Encounter (Signed)
This is a Dr Daleen Squibb patient

## 2010-07-30 ENCOUNTER — Encounter: Payer: Self-pay | Admitting: Cardiology

## 2010-07-31 ENCOUNTER — Ambulatory Visit (INDEPENDENT_AMBULATORY_CARE_PROVIDER_SITE_OTHER): Payer: Medicare Other | Admitting: Cardiology

## 2010-07-31 ENCOUNTER — Encounter: Payer: Self-pay | Admitting: Cardiology

## 2010-07-31 VITALS — BP 126/79 | HR 79 | Ht 68.0 in | Wt 248.4 lb

## 2010-07-31 DIAGNOSIS — I509 Heart failure, unspecified: Secondary | ICD-10-CM

## 2010-07-31 DIAGNOSIS — I25810 Atherosclerosis of coronary artery bypass graft(s) without angina pectoris: Secondary | ICD-10-CM

## 2010-07-31 DIAGNOSIS — R609 Edema, unspecified: Secondary | ICD-10-CM

## 2010-07-31 DIAGNOSIS — I252 Old myocardial infarction: Secondary | ICD-10-CM

## 2010-07-31 DIAGNOSIS — I5032 Chronic diastolic (congestive) heart failure: Secondary | ICD-10-CM

## 2010-07-31 DIAGNOSIS — I4891 Unspecified atrial fibrillation: Secondary | ICD-10-CM

## 2010-07-31 DIAGNOSIS — I2789 Other specified pulmonary heart diseases: Secondary | ICD-10-CM

## 2010-07-31 MED ORDER — FUROSEMIDE 80 MG PO TABS: 80.0000 mg | ORAL_TABLET | Freq: Every day | ORAL | Status: DC

## 2010-07-31 MED ORDER — POTASSIUM CHLORIDE CRYS ER 20 MEQ PO TBCR: 20.0000 meq | EXTENDED_RELEASE_TABLET | Freq: Every day | ORAL | Status: DC

## 2010-07-31 NOTE — Assessment & Plan Note (Signed)
Stable clinically.

## 2010-07-31 NOTE — Assessment & Plan Note (Signed)
This is worse with increasing edema. He has other reasons to have increased edema including dietary indiscretion, obesity, and pulmonary hypertension. He is in sinus rhythm he has no symptoms of ischemia. Will increase Lasix to 80 mg each morning,  20 mEq potassium per day, sodium restriction, and loose at least 25-30 pounds. I will see back in about 6 weeks for followup. If he does not respond to this we will check a 2-D echocardiogram.

## 2010-07-31 NOTE — Patient Instructions (Signed)
Your physician has recommended you make the following change in your medication: Increase furosemide (lasix)  START Potassium  Your physician recommends that you return for lab work in: 10 days for a bmet   Your physician recommends that you schedule a follow-up appointment in: 6 weeks with Dr. Daleen Squibb  Your physician encouraged you to lose weight for better health.  It is recommended that you lose 1 pound a week  For a total of 25-30 pounds.  It is important that you reduce the salt (sodium)

## 2010-07-31 NOTE — Progress Notes (Signed)
HPI [name] returns today with the complaint of increased lower extremity swelling. He's always had swelling in his right lower extremity from previous injury as a child with lots of scar tissue. He's also had a DVT in that leg. His left leg is in swelling more. He denies any pain, fever, chills, or hemoptysis.  His weight remains quite high and he admits to dietary indiscretion. He denies any orthopnea or PND.  He's had no tachycardia palpitations and no angina.  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 Hyperlipidemia   . Diabetes mellitus   . Hypertension     Past Surgical History  Procedure Date  . Coronary artery bypass graft     x 3    Family History  Problem Relation Age of Onset  . Brain cancer Mother     died age 5  . Heart attack Father     age 65    History   Social History  . Marital Status: Married    Spouse Name: N/A    Number of Children: N/A  . Years of Education: N/A   Occupational History  . Not on file.   Social History Main Topics  . Smoking status: Former Smoker    Quit date: 07/30/1960  . Smokeless tobacco: Not on file  . Alcohol Use: No  . Drug Use: No  . Sexually Active: Not on file   Other Topics Concern  . 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 Denies alcohol use. He does have three sisters and eight brothers all younger with no known health problems.    Allergies  Allergen Reactions  . Heparin     REACTION: platelets decreased    Current Outpatient Prescriptions  Medication Sig Dispense Refill  . aspirin 325 MG tablet Take 325 mg by mouth daily.        . furosemide (LASIX) 40 MG tablet Take 40 mg by mouth daily.       Marland Kitchen losartan (COZAAR) 50 MG tablet Take 50 mg by mouth daily.        . metFORMIN (GLUCOPHAGE-XR) 750 MG 24 hr tablet Take 750 mg by mouth daily with  breakfast.        . psyllium (METAMUCIL) 58.6 % powder Take 1 packet by mouth 3 (three) times daily.        . rosuvastatin (CRESTOR) 40 MG tablet Take 40 mg by mouth daily.        . sildenafil (VIAGRA) 50 MG tablet Take 50 mg by mouth daily as needed.        Marland Kitchen DISCONTD: losartan (COZAAR) 50 MG tablet Take 50 mg by mouth daily.        Marland Kitchen DISCONTD: warfarin (COUMADIN) 5 MG tablet Take by mouth as directed.          ROS Negative other than HPI.   PE General Appearance: well developed, well nourished in no acute distress, obese HEENT: symmetrical face, PERRLA, good dentition  Neck: no JVD, thyromegaly, or adenopathy, trachea midline Chest: symmetric without deformity Cardiac: PMI non-displaced, RRR, normal S1, S2, no gallop or murmur Lung: clear to ausculation and percussion Vascular: all pulses full without bruits  Abdominal: nondistended, nontender, good bowel sounds, no HSM, no bruits Extremities: no cyanosis, clubbing , scarring and chronic swelling with 2+ pitting edema in  the right lower extremity and 2+ pitting edema in the ankle and foot on the left lower stenting no sign of DVT, no varicosities  Skin: normal color, no rashes Neuro: alert and oriented x 3, non-focal Pysch: normal affect Filed Vitals:   07/31/10 1447  BP: 126/79  Pulse: 79  Height: 5\' 8"  (1.727 m)  Weight: 248 lb 6.4 oz (112.674 kg)    EKG  Labs and Studies Reviewed.   Lab Results  Component Value Date   WBC 7.7 02/25/2010   HGB 13.7 02/25/2010   HCT 41.5 02/25/2010   MCV 85.0 02/25/2010   PLT 185.0 02/25/2010      Chemistry      Component Value Date/Time   NA 140 03/11/2010 1027   K 4.2 03/11/2010 1027   CL 107 03/11/2010 1027   CO2 29 03/11/2010 1027   BUN 19 03/11/2010 1027   CREATININE 1.0 03/11/2010 1027      Component Value Date/Time   CALCIUM 8.9 03/11/2010 1027   ALKPHOS 62 10/23/2009 0917   AST 71* 10/23/2009 0917   ALT 87* 10/23/2009 0917   BILITOT 0.5 10/23/2009 0917       Lab Results    Component Value Date   CHOL 197 10/23/2009   CHOL  Value: 186        ATP III CLASSIFICATION:  <200     mg/dL   Desirable  161-096  mg/dL   Borderline High  >=045    mg/dL   High        05/11/8117   Lab Results  Component Value Date   HDL 63.60 10/23/2009   HDL 62 07/21/2009   Lab Results  Component Value Date   LDLCALC 110* 10/23/2009   LDLCALC  Value: 107        Total Cholesterol/HDL:CHD Risk Coronary Heart Disease Risk Table                     Men   Women  1/2 Average Risk   3.4   3.3  Average Risk       5.0   4.4  2 X Average Risk   9.6   7.1  3 X Average Risk  23.4   11.0        Use the calculated Patient Ratio above and the CHD Risk Table to determine the patient's CHD Risk.        ATP III CLASSIFICATION (LDL):  <100     mg/dL   Optimal  147-829  mg/dL   Near or Above                    Optimal  130-159  mg/dL   Borderline  562-130  mg/dL   High  >865     mg/dL   Very High* 7/84/6962   Lab Results  Component Value Date   TRIG 118.0 10/23/2009   TRIG 87 07/21/2009   Lab Results  Component Value Date   CHOLHDL 3 10/23/2009   CHOLHDL 3.0 07/21/2009   Lab Results  Component Value Date   HGBA1C  Value: 7.7 (NOTE)  According to the ADA Clinical Practice Recommendations for 2011, when HbA1c is used as a screening test:   >=6.5%   Diagnostic of Diabetes Mellitus           (if abnormal result  is confirmed)  5.7-6.4%   Increased risk of developing Diabetes Mellitus  References:Diagnosis and Classification of Diabetes Mellitus,Diabetes Care,2011,34(Suppl 1):S62-S69 and Standards of Medical Care in         Diabetes - 2011,Diabetes Care,2011,34  (Suppl 1):S11-S61.* 08/06/2009   Lab Results  Component Value Date   ALT 87* 10/23/2009   AST 71* 10/23/2009   ALKPHOS 62 10/23/2009   BILITOT 0.5 10/23/2009   Lab Results  Component Value Date   TSH 3.197 **Test methodology is 3rd generation TSH** 08/06/2009

## 2010-07-31 NOTE — Assessment & Plan Note (Signed)
improved

## 2010-08-08 ENCOUNTER — Other Ambulatory Visit (INDEPENDENT_AMBULATORY_CARE_PROVIDER_SITE_OTHER): Payer: Medicare Other | Admitting: *Deleted

## 2010-08-08 DIAGNOSIS — I5032 Chronic diastolic (congestive) heart failure: Secondary | ICD-10-CM

## 2010-08-08 DIAGNOSIS — I509 Heart failure, unspecified: Secondary | ICD-10-CM

## 2010-08-08 DIAGNOSIS — R609 Edema, unspecified: Secondary | ICD-10-CM

## 2010-08-08 LAB — BASIC METABOLIC PANEL
Calcium: 9.2 mg/dL (ref 8.4–10.5)
Creatinine, Ser: 1 mg/dL (ref 0.4–1.5)
GFR: 91.81 mL/min (ref >60.00)
Sodium: 137 mEq/L (ref 135–145)

## 2010-09-09 ENCOUNTER — Encounter: Payer: Self-pay | Admitting: Cardiology

## 2010-09-09 ENCOUNTER — Ambulatory Visit (INDEPENDENT_AMBULATORY_CARE_PROVIDER_SITE_OTHER): Payer: Medicare Other | Admitting: Cardiology

## 2010-09-09 VITALS — BP 122/82 | HR 88 | Ht 68.0 in | Wt 241.0 lb

## 2010-09-09 DIAGNOSIS — I25810 Atherosclerosis of coronary artery bypass graft(s) without angina pectoris: Secondary | ICD-10-CM

## 2010-09-09 DIAGNOSIS — E785 Hyperlipidemia, unspecified: Secondary | ICD-10-CM

## 2010-09-09 DIAGNOSIS — I252 Old myocardial infarction: Secondary | ICD-10-CM

## 2010-09-09 DIAGNOSIS — I509 Heart failure, unspecified: Secondary | ICD-10-CM

## 2010-09-09 DIAGNOSIS — I4891 Unspecified atrial fibrillation: Secondary | ICD-10-CM

## 2010-09-09 DIAGNOSIS — I5032 Chronic diastolic (congestive) heart failure: Secondary | ICD-10-CM

## 2010-09-09 NOTE — Assessment & Plan Note (Signed)
Stable. No change in treatment. 

## 2010-09-09 NOTE — Progress Notes (Signed)
HPI Patrick Massey comes in today for an evaluation and management his coronary disease, history of bypass surgery, chronic diastolic heart failure, history of DVT and pulmonary embolus, history of A. Fib, hypertension, and hyperlipidemia.  He has lost 7 pounds. He is having no angina or ischemic symptoms. His blood pressure has done better control.  He is due blood work including lipids. Recent electrolytes were stable. His edema has also been well-controlled on 80 mg of Lasix. Past Medical History  Diagnosis Date  . Hyperlipidemia   . Diabetes mellitus   . Hypertension     Past Surgical History  Procedure Date  . Coronary artery bypass graft     x 3    Family History  Problem Relation Age of Onset  . Brain cancer Mother     died age 52  . Heart attack Father     age 2    History   Social History  . Marital Status: Married    Spouse Name: N/A    Number of Children: N/A  . Years of Education: N/A   Occupational History  . Not on file.   Social History Main Topics  . Smoking status: Former Smoker    Quit date: 07/30/1960  . Smokeless tobacco: Not on file  . Alcohol Use: No  . Drug Use: No  . Sexually Active: Not on file   Other Topics Concern  . 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 Denies alcohol use. He does have three sisters and eight brothers all younger with no known health problems.    Allergies  Allergen Reactions  . Heparin     REACTION: platelets decreased    Current Outpatient Prescriptions  Medication Sig Dispense Refill  . aspirin 325 MG tablet Take 325 mg by mouth daily.        . furosemide (LASIX) 80 MG tablet Take 1 tablet (80 mg total) by mouth daily.  30 tablet  6  . losartan (COZAAR) 50 MG tablet Take 50 mg by mouth daily.        . metFORMIN (GLUCOPHAGE-XR) 750 MG 24 hr tablet Take 750 mg by mouth daily with breakfast.        . potassium chloride SA  (K-DUR,KLOR-CON) 20 MEQ tablet Take 1 tablet (20 mEq total) by mouth daily.  30 tablet  6  . psyllium (METAMUCIL) 58.6 % powder Take 1 packet by mouth 3 (three) times daily.        . rosuvastatin (CRESTOR) 40 MG tablet Take 40 mg by mouth daily.        . sildenafil (VIAGRA) 50 MG tablet Take 50 mg by mouth daily as needed.          ROS Negative other than HPI.   PE General Appearance: well developed, well nourished in no acute distress, obese HEENT: symmetrical face, PERRLA, good dentition  Neck: no JVD, thyromegaly, or adenopathy, trachea midline Chest: symmetric without deformity Cardiac: PMI non-displaced, RRR, normal S1, S2, no gallop or murmur Lung: clear to ausculation and percussion Vascular: all pulses full without bruits  Abdominal: nondistended, nontender, good bowel sounds, no HSM, no bruits Extremities: no cyanosis, clubbing or edema, no sign of DVT, no varicosities  Skin: normal color, no rashes Neuro: alert and oriented x 3, non-focal Pysch: normal affect Filed Vitals:   09/09/10 1352  BP: 122/82  Pulse: 88  Height: 5\' 8"  (1.727  m)  Weight: 241 lb (109.317 kg)    EKG  Labs and Studies Reviewed.   Lab Results  Component Value Date   WBC 7.7 02/25/2010   HGB 13.7 02/25/2010   HCT 41.5 02/25/2010   MCV 85.0 02/25/2010   PLT 185.0 02/25/2010      Chemistry      Component Value Date/Time   NA 137 08/08/2010 1007   K 4.7 08/08/2010 1007   CL 105 08/08/2010 1007   CO2 28 08/08/2010 1007   BUN 17 08/08/2010 1007   CREATININE 1.0 08/08/2010 1007      Component Value Date/Time   CALCIUM 9.2 08/08/2010 1007   ALKPHOS 62 10/23/2009 0917   AST 71* 10/23/2009 0917   ALT 87* 10/23/2009 0917   BILITOT 0.5 10/23/2009 0917       Lab Results  Component Value Date   CHOL 197 10/23/2009   CHOL  Value: 186        ATP III CLASSIFICATION:  <200     mg/dL   Desirable  782-956  mg/dL   Borderline High  >=213    mg/dL   High        0/86/5784   Lab Results  Component Value Date   HDL  63.60 10/23/2009   HDL 62 07/21/2009   Lab Results  Component Value Date   LDLCALC 110* 10/23/2009   LDLCALC  Value: 107        Total Cholesterol/HDL:CHD Risk Coronary Heart Disease Risk Table                     Men   Women  1/2 Average Risk   3.4   3.3  Average Risk       5.0   4.4  2 X Average Risk   9.6   7.1  3 X Average Risk  23.4   11.0        Use the calculated Patient Ratio above and the CHD Risk Table to determine the patient's CHD Risk.        ATP III CLASSIFICATION (LDL):  <100     mg/dL   Optimal  696-295  mg/dL   Near or Above                    Optimal  130-159  mg/dL   Borderline  284-132  mg/dL   High  >440     mg/dL   Very High* 02/04/7251   Lab Results  Component Value Date   TRIG 118.0 10/23/2009   TRIG 87 07/21/2009   Lab Results  Component Value Date   CHOLHDL 3 10/23/2009   CHOLHDL 3.0 07/21/2009   Lab Results  Component Value Date   HGBA1C  Value: 7.7 (NOTE)                                                                       According to the ADA Clinical Practice Recommendations for 2011, when HbA1c is used as a screening test:   >=6.5%   Diagnostic of Diabetes Mellitus           (if abnormal result  is confirmed)  5.7-6.4%   Increased risk of developing Diabetes Mellitus  References:Diagnosis and Classification of Diabetes Mellitus,Diabetes Care,2011,34(Suppl 1):S62-S69 and Standards of Medical Care in         Diabetes - 2011,Diabetes Care,2011,34  (Suppl 1):S11-S61.* 08/06/2009   Lab Results  Component Value Date   ALT 87* 10/23/2009   AST 71* 10/23/2009   ALKPHOS 62 10/23/2009   BILITOT 0.5 10/23/2009

## 2010-09-09 NOTE — Progress Notes (Signed)
Addended by: Mylo Red F on: 09/09/2010 02:54 PM   Modules accepted: Orders

## 2010-09-09 NOTE — Assessment & Plan Note (Signed)
Stable. No change in treatment. Recent blood work was stable.

## 2010-09-09 NOTE — Patient Instructions (Signed)
Your physician recommends that you schedule a follow-up appointment in: 6 months with Dr. Daleen Squibb  Your physician recommends that you return for lab work in: Tuesday August 14,2012 for fasting cholesterol

## 2010-09-16 ENCOUNTER — Other Ambulatory Visit: Payer: Medicare Other | Admitting: *Deleted

## 2010-09-22 ENCOUNTER — Other Ambulatory Visit (INDEPENDENT_AMBULATORY_CARE_PROVIDER_SITE_OTHER): Payer: Medicare Other | Admitting: *Deleted

## 2010-09-22 DIAGNOSIS — E785 Hyperlipidemia, unspecified: Secondary | ICD-10-CM

## 2010-09-22 LAB — HEPATIC FUNCTION PANEL
ALT: 22 U/L (ref 0–53)
AST: 24 U/L (ref 0–37)
Albumin: 3.8 g/dL (ref 3.5–5.2)
Alkaline Phosphatase: 69 U/L (ref 39–117)
Total Protein: 6.8 g/dL (ref 6.0–8.3)

## 2010-09-22 LAB — LIPID PANEL
Cholesterol: 148 mg/dL (ref 0–200)
Total CHOL/HDL Ratio: 3
Triglycerides: 101 mg/dL (ref 0.0–149.0)

## 2010-10-11 ENCOUNTER — Other Ambulatory Visit: Payer: Self-pay | Admitting: Cardiology

## 2010-11-15 ENCOUNTER — Other Ambulatory Visit: Payer: Self-pay | Admitting: Cardiology

## 2010-11-25 ENCOUNTER — Ambulatory Visit: Payer: Self-pay | Admitting: Cardiology

## 2010-11-25 DIAGNOSIS — I82409 Acute embolism and thrombosis of unspecified deep veins of unspecified lower extremity: Secondary | ICD-10-CM

## 2010-11-25 DIAGNOSIS — I4891 Unspecified atrial fibrillation: Secondary | ICD-10-CM

## 2010-11-25 DIAGNOSIS — I2699 Other pulmonary embolism without acute cor pulmonale: Secondary | ICD-10-CM

## 2010-12-11 NOTE — Telephone Encounter (Signed)
Think this note was sent in error  Note is from April

## 2011-04-15 ENCOUNTER — Telehealth: Payer: Self-pay | Admitting: Cardiology

## 2011-04-15 NOTE — Telephone Encounter (Signed)
New Msg: pt calling wanting to speak with nurse about pt wanting to get a EKG. Please return pt call to discuss further.

## 2011-04-15 NOTE — Telephone Encounter (Signed)
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

## 2011-04-16 ENCOUNTER — Encounter: Payer: Self-pay | Admitting: Cardiology

## 2011-04-16 ENCOUNTER — Encounter: Payer: Self-pay | Admitting: *Deleted

## 2011-04-16 ENCOUNTER — Ambulatory Visit (INDEPENDENT_AMBULATORY_CARE_PROVIDER_SITE_OTHER): Payer: Medicare Other | Admitting: Cardiology

## 2011-04-16 VITALS — BP 144/88 | HR 80 | Ht 69.0 in | Wt 247.0 lb

## 2011-04-16 DIAGNOSIS — I509 Heart failure, unspecified: Secondary | ICD-10-CM

## 2011-04-16 DIAGNOSIS — I2789 Other specified pulmonary heart diseases: Secondary | ICD-10-CM

## 2011-04-16 DIAGNOSIS — E785 Hyperlipidemia, unspecified: Secondary | ICD-10-CM

## 2011-04-16 DIAGNOSIS — I10 Essential (primary) hypertension: Secondary | ICD-10-CM

## 2011-04-16 DIAGNOSIS — I5032 Chronic diastolic (congestive) heart failure: Secondary | ICD-10-CM

## 2011-04-16 DIAGNOSIS — I25810 Atherosclerosis of coronary artery bypass graft(s) without angina pectoris: Secondary | ICD-10-CM

## 2011-04-16 DIAGNOSIS — I82409 Acute embolism and thrombosis of unspecified deep veins of unspecified lower extremity: Secondary | ICD-10-CM

## 2011-04-16 DIAGNOSIS — I2510 Atherosclerotic heart disease of native coronary artery without angina pectoris: Secondary | ICD-10-CM

## 2011-04-16 DIAGNOSIS — I4891 Unspecified atrial fibrillation: Secondary | ICD-10-CM

## 2011-04-16 DIAGNOSIS — I252 Old myocardial infarction: Secondary | ICD-10-CM

## 2011-04-16 NOTE — Assessment & Plan Note (Signed)
Continue medical therapy. Repeat fasting lipid profile and liver function studies in August.

## 2011-04-16 NOTE — Assessment & Plan Note (Signed)
Stable. No change in therapy. Continue aggressive secondary prevention.

## 2011-04-16 NOTE — Progress Notes (Signed)
HPI Patrick Massey Returns today for evaluation and management of his history of coronary disease, history of myocardial infarction, history of coronary artery bypass grafting, history of pulmonary embolus postop, history for hypertension, history of postop A. Fib, history of hypertension, a history of lower extremity pain.  He's doing remarkably well he continues to his own yard work. He is driving a school bus part time. His paperwork for a day for approving his license for driving.  He denies any chest pain or angina. He does have some dyspnea on exertion climbing steps. Stable. His weight is stable. He is compliant with his meds. Blood work to be repeated in August. He does not have a primary care physician.  Past Medical History  Diagnosis Date  . Hyperlipidemia   . Diabetes mellitus   . Hypertension     Current Outpatient Prescriptions  Medication Sig Dispense Refill  . aspirin 325 MG tablet Take 325 mg by mouth daily.        . CRESTOR 40 MG tablet TAKE 1 TABLET BY MOUTH EVERY DAY  30 tablet  8  . furosemide (LASIX) 80 MG tablet Take 1 tablet (80 mg total) by mouth daily.  30 tablet  6  . glimepiride (AMARYL) 4 MG tablet Take 1 tablet by mouth Twice daily.      Marland Kitchen losartan (COZAAR) 50 MG tablet TAKE 1 TABLET DAILY  30 tablet  6  . metFORMIN (GLUCOPHAGE-XR) 750 MG 24 hr tablet Take 750 mg by mouth daily with breakfast.        . potassium chloride SA (K-DUR,KLOR-CON) 20 MEQ tablet Take 1 tablet (20 mEq total) by mouth daily.  30 tablet  6  . psyllium (METAMUCIL) 58.6 % powder Take 1 packet by mouth 3 (three) times daily.        . sildenafil (VIAGRA) 50 MG tablet Take 50 mg by mouth daily as needed.          Allergies  Allergen Reactions  . Heparin     REACTION: platelets decreased    Family History  Problem Relation Age of Onset  . Brain cancer Mother     died age 38  . Heart attack Father     age 53    History   Social History  . Marital Status: Married    Spouse Name: N/A     Number of Children: N/A  . Years of Education: N/A   Occupational History  . Not on file.   Social History Main Topics  . Smoking status: Former Smoker    Quit date: 07/30/1960  . Smokeless tobacco: Not on file  . Alcohol Use: No  . Drug Use: No  . Sexually Active: Not on file   Other Topics Concern  . 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 Denies alcohol use. He does have three sisters and eight brothers all younger with no known health problems.    ROS ALL NEGATIVE EXCEPT THOSE NOTED IN HPI  PE  General Appearance: well developed, well nourished in no acute distress, muscular, overweight HEENT: symmetrical face, PERRLA, good dentition  Neck: no JVD, thyromegaly, or adenopathy, trachea midline Chest: symmetric without deformity Cardiac: PMI non-displaced, RRR, normal S1, S2, no gallop or murmur Lung: clear to ausculation and percussion Vascular: all pulses full without bruits  Abdominal: nondistended, nontender, good bowel sounds, no HSM, no bruits Extremities: no cyanosis, clubbing, no  sign of DVT, no varicosities, chronic scarring in the right lower extremity, 1+ pitting edema bilaterally. Skin: normal color, no rashes Neuro: alert and oriented x 3, non-focal Pysch: normal affect  EKG Normal sinus rhythm, normal EKG BMET    Component Value Date/Time   NA 137 08/08/2010 1007   K 4.7 08/08/2010 1007   CL 105 08/08/2010 1007   CO2 28 08/08/2010 1007   GLUCOSE 199* 08/08/2010 1007   BUN 17 08/08/2010 1007   CREATININE 1.0 08/08/2010 1007   CALCIUM 9.2 08/08/2010 1007   GFRNONAA 89.03 10/23/2009 0917   GFRAA  Value: >60        The eGFR has been calculated using the MDRD equation. This calculation has not been validated in all clinical situations. eGFR's persistently <60 mL/min signify possible Chronic Kidney Disease. 08/17/2009 0420    Lipid Panel     Component Value Date/Time   CHOL 148  09/22/2010 0903   TRIG 101.0 09/22/2010 0903   HDL 57.10 09/22/2010 0903   CHOLHDL 3 09/22/2010 0903   VLDL 20.2 09/22/2010 0903   LDLCALC 71 09/22/2010 0903    CBC    Component Value Date/Time   WBC 7.7 02/25/2010 1205   RBC 4.88 02/25/2010 1205   HGB 13.7 02/25/2010 1205   HCT 41.5 02/25/2010 1205   PLT 185.0 02/25/2010 1205   MCV 85.0 02/25/2010 1205   MCH 26.5 08/17/2009 0420   MCHC 33.0 02/25/2010 1205   RDW 13.8 02/25/2010 1205   LYMPHSABS 3.2 02/25/2010 1205   MONOABS 0.6 02/25/2010 1205   EOSABS 0.2 02/25/2010 1205   BASOSABS 0.0 02/25/2010 1205

## 2011-04-16 NOTE — Patient Instructions (Signed)
Your physician wants you to follow-up in: 5 months. You will receive a reminder letter in the mail two months in advance. If you don't receive a letter, please call our office to schedule the follow-up appointment.   FASTING LABS at [facility] APPT:  HGB A1c, CMP, LIPID Profile

## 2011-04-16 NOTE — Assessment & Plan Note (Signed)
Under good control averaging in the 130s over 60

## 2011-05-04 ENCOUNTER — Other Ambulatory Visit: Payer: Self-pay | Admitting: Cardiology

## 2011-05-31 ENCOUNTER — Other Ambulatory Visit: Payer: Self-pay | Admitting: Cardiology

## 2011-10-16 ENCOUNTER — Telehealth: Payer: Self-pay | Admitting: *Deleted

## 2011-10-16 NOTE — Telephone Encounter (Signed)
Called and reminded pt of fasting lab work. His uncle recently passed & will call office back if he needs to reschedule appt. Mylo Red RN

## 2011-10-19 ENCOUNTER — Encounter: Payer: Self-pay | Admitting: Cardiology

## 2011-10-19 ENCOUNTER — Ambulatory Visit (INDEPENDENT_AMBULATORY_CARE_PROVIDER_SITE_OTHER): Payer: Medicare Other | Admitting: Cardiology

## 2011-10-19 VITALS — BP 123/75 | HR 85 | Ht 68.0 in | Wt 242.1 lb

## 2011-10-19 DIAGNOSIS — I10 Essential (primary) hypertension: Secondary | ICD-10-CM

## 2011-10-19 DIAGNOSIS — Z79899 Other long term (current) drug therapy: Secondary | ICD-10-CM

## 2011-10-19 DIAGNOSIS — I25810 Atherosclerosis of coronary artery bypass graft(s) without angina pectoris: Secondary | ICD-10-CM

## 2011-10-19 DIAGNOSIS — I2789 Other specified pulmonary heart diseases: Secondary | ICD-10-CM

## 2011-10-19 DIAGNOSIS — E785 Hyperlipidemia, unspecified: Secondary | ICD-10-CM

## 2011-10-19 DIAGNOSIS — I4891 Unspecified atrial fibrillation: Secondary | ICD-10-CM

## 2011-10-19 DIAGNOSIS — I498 Other specified cardiac arrhythmias: Secondary | ICD-10-CM

## 2011-10-19 DIAGNOSIS — I5032 Chronic diastolic (congestive) heart failure: Secondary | ICD-10-CM

## 2011-10-19 DIAGNOSIS — R609 Edema, unspecified: Secondary | ICD-10-CM

## 2011-10-19 DIAGNOSIS — I2510 Atherosclerotic heart disease of native coronary artery without angina pectoris: Secondary | ICD-10-CM

## 2011-10-19 DIAGNOSIS — I252 Old myocardial infarction: Secondary | ICD-10-CM

## 2011-10-19 DIAGNOSIS — E119 Type 2 diabetes mellitus without complications: Secondary | ICD-10-CM

## 2011-10-19 DIAGNOSIS — Z86711 Personal history of pulmonary embolism: Secondary | ICD-10-CM

## 2011-10-19 LAB — COMPREHENSIVE METABOLIC PANEL
Albumin: 3.7 g/dL (ref 3.5–5.2)
Alkaline Phosphatase: 79 U/L (ref 39–117)
BUN: 17 mg/dL (ref 6–23)
CO2: 25 mEq/L (ref 19–32)
Calcium: 9.4 mg/dL (ref 8.4–10.5)
Chloride: 105 mEq/L (ref 96–112)
Glucose, Bld: 137 mg/dL — ABNORMAL HIGH (ref 70–99)
Potassium: 3.7 mEq/L (ref 3.5–5.1)
Sodium: 139 mEq/L (ref 135–145)
Total Protein: 7.2 g/dL (ref 6.0–8.3)

## 2011-10-19 LAB — HEMOGLOBIN A1C: Hgb A1c MFr Bld: 8 % — ABNORMAL HIGH (ref 4.6–6.5)

## 2011-10-19 LAB — LIPID PANEL
Cholesterol: 207 mg/dL — ABNORMAL HIGH (ref 0–200)
VLDL: 38.6 mg/dL (ref 0.0–40.0)

## 2011-10-19 NOTE — Assessment & Plan Note (Signed)
Stable and improved. Continue Lasix.

## 2011-10-19 NOTE — Progress Notes (Signed)
HPI Patrick Massey returns today for evaluation and management his coronary artery disease, history of coronary bypass grafting, history of postoperative atrial fibrillation, history of postoperative pulmonary embolus and DVT of his right lower extremity, hypertension, and hyperlipidemia. He initially presented with a out-of-hospital MI.  Past Medical History  Diagnosis Date  . Hyperlipidemia   . Diabetes mellitus   . Hypertension     Current Outpatient Prescriptions  Medication Sig Dispense Refill  . aspirin 325 MG tablet Take 325 mg by mouth daily.        . CRESTOR 40 MG tablet TAKE 1 TABLET BY MOUTH EVERY DAY  30 tablet  8  . furosemide (LASIX) 80 MG tablet TAKE 1 TABLET (80 MG TOTAL) BY MOUTH DAILY.  30 tablet  6  . glimepiride (AMARYL) 4 MG tablet Take 1 tablet by mouth Twice daily.      Marland Kitchen losartan (COZAAR) 50 MG tablet TAKE 1 TABLET DAILY  30 tablet  6  . metFORMIN (GLUCOPHAGE-XR) 750 MG 24 hr tablet Take 750 mg by mouth daily with breakfast.        . potassium chloride SA (K-DUR,KLOR-CON) 20 MEQ tablet Take 1 tablet (20 mEq total) by mouth daily.  30 tablet  6  . psyllium (METAMUCIL) 58.6 % powder Take 1 packet by mouth 3 (three) times daily.        . sildenafil (VIAGRA) 50 MG tablet Take 50 mg by mouth daily as needed.          Allergies  Allergen Reactions  . Heparin     REACTION: platelets decreased    Family History  Problem Relation Age of Onset  . Brain cancer Mother     died age 80  . Heart attack Father     age 38    History   Social History  . Marital Status: Married    Spouse Name: N/A    Number of Children: N/A  . Years of Education: N/A   Occupational History  . Not on file.   Social History Main Topics  . Smoking status: Former Smoker    Quit date: 07/30/1960  . Smokeless tobacco: Not on file  . Alcohol Use: No  . Drug Use: No  . Sexually Active: Not on file   Other Topics Concern  . 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 Denies alcohol use. He does have three sisters and eight brothers all younger with no known health problems.    ROS ALL NEGATIVE EXCEPT THOSE NOTED IN HPI  PE  General Appearance: well developed, well nourished in no acute distress, muscular, a HEENT: symmetrical face, PERRLA, good dentition  Neck: no JVD, thyromegaly, or adenopathy, trachea midline Chest: symmetric without deformity Cardiac: PMI non-displaced, RRR, normal S1, S2, no gallop or murmur Lung: clear to ausculation and percussion Vascular: all pulses full without bruits  Abdominal: nondistended, nontender, good bowel sounds, no HSM, no bruits Extremities: no cyanosis, clubbing or edema, no sign of DVT, no varicosities  Skin: normal color, no rashes Neuro: alert and oriented x 3, non-focal Pysch: normal affect  EKG EKG not repeated. BMET    Component Value Date/Time   NA 137 08/08/2010 1007   K 4.7 08/08/2010 1007   CL 105 08/08/2010 1007   CO2 28 08/08/2010 1007   GLUCOSE 199* 08/08/2010 1007   BUN 17 08/08/2010 1007   CREATININE 1.0 08/08/2010  1007   CALCIUM 9.2 08/08/2010 1007   GFRNONAA 89.03 10/23/2009 0917   GFRAA  Value: >60        The eGFR has been calculated using the MDRD equation. This calculation has not been validated in all clinical situations. eGFR's persistently <60 mL/min signify possible Chronic Kidney Disease. 08/17/2009 0420    Lipid Panel     Component Value Date/Time   CHOL 148 09/22/2010 0903   TRIG 101.0 09/22/2010 0903   HDL 57.10 09/22/2010 0903   CHOLHDL 3 09/22/2010 0903   VLDL 20.2 09/22/2010 0903   LDLCALC 71 09/22/2010 0903    CBC    Component Value Date/Time   WBC 7.7 02/25/2010 1205   RBC 4.88 02/25/2010 1205   HGB 13.7 02/25/2010 1205   HCT 41.5 02/25/2010 1205   PLT 185.0 02/25/2010 1205   MCV 85.0 02/25/2010 1205   MCH 26.5 08/17/2009 0420   MCHC 33.0 02/25/2010 1205   RDW 13.8 02/25/2010 1205   LYMPHSABS 3.2 02/25/2010 1205    MONOABS 0.6 02/25/2010 1205   EOSABS 0.2 02/25/2010 1205   BASOSABS 0.0 02/25/2010 1205

## 2011-10-19 NOTE — Assessment & Plan Note (Signed)
He is overdue lipids. He may have had  drawn by primary care but do not have results. I'll call him today. He would like to change to a generic statin.

## 2011-10-19 NOTE — Patient Instructions (Addendum)
Your physician wants you to follow-up in: 1 year. You will receive a reminder letter in the mail two months in advance. If you don't receive a letter, please call our office to schedule the follow-up appointment.  

## 2011-10-19 NOTE — Assessment & Plan Note (Signed)
No clinical recurrence.

## 2011-10-19 NOTE — Assessment & Plan Note (Signed)
Postoperative only. No clinical recurrence.

## 2011-10-19 NOTE — Assessment & Plan Note (Signed)
Stable. Continue secondary preventative therapy. Return the office in one year. 

## 2011-10-19 NOTE — Assessment & Plan Note (Signed)
Check hemoglobin A1c. Goal less than 7%.

## 2011-10-19 NOTE — Assessment & Plan Note (Signed)
Improved. No change in medical therapy. 

## 2011-10-29 ENCOUNTER — Other Ambulatory Visit: Payer: Self-pay | Admitting: Cardiology

## 2011-11-23 IMAGING — CR DG CHEST 1V PORT
1 series · 1 of 1 positions shown · non-contrast
Comparison: 07/25/2009

CLINICAL DATA: Status post CABG, follow-up

PORTABLE CHEST - 1 VIEW

[AP]
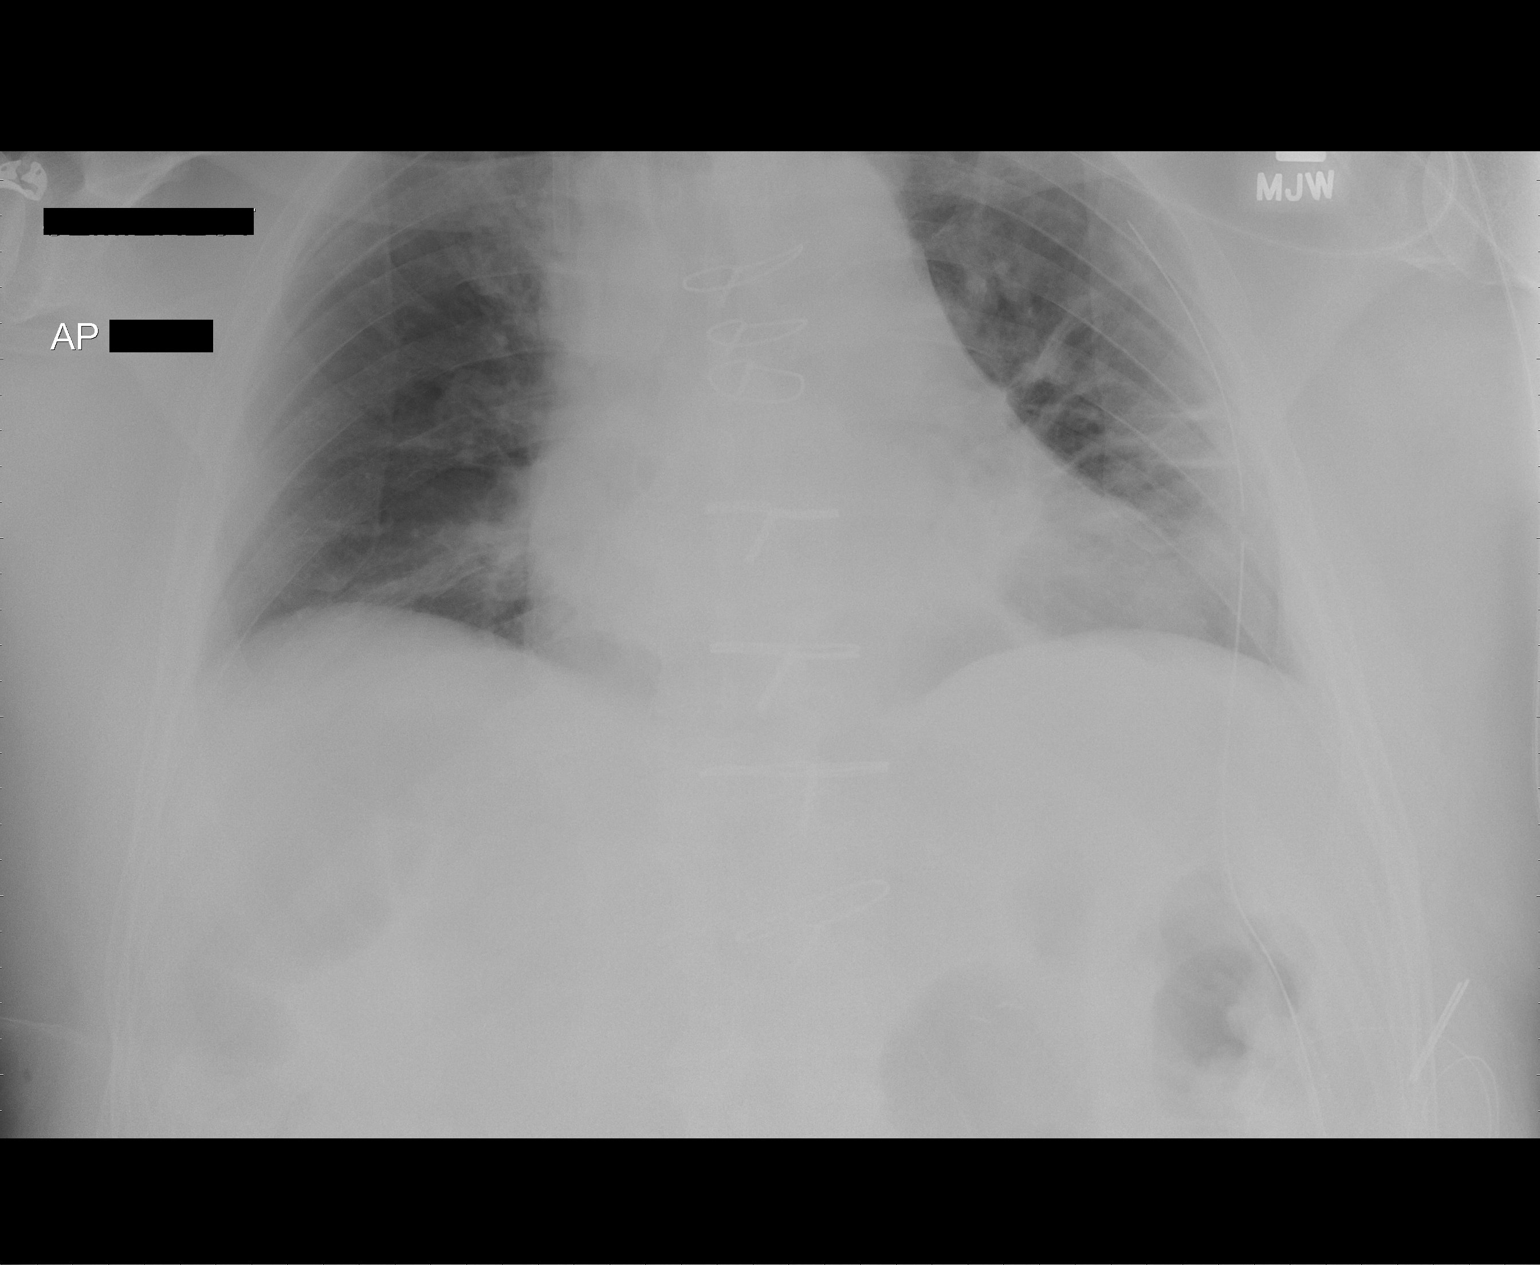

[1 of 1 positions shown; findings below may reference images not displayed]

FINDINGS: Right IJ Swan-Ganz catheter has been removed and the
sheath remains in place.  Apices not well visualized due to
technique and overlapping soft tissue structures.  Allowing for
this, no pneumothorax is seen.  Left-sided chest tube remains in
place.  Evidence of median sternotomy again noted.  Lung volumes
are low with linear left mid and bilateral lower lobe atelectasis.
Small left effusion.
IMPRESSION: Support apparatus as above.

Stable bibasilar linear probable atelectasis.

## 2011-11-24 IMAGING — CR DG CHEST 2V
2 series · 2 of 2 positions shown · non-contrast
Comparison: 07/26/2009

CLINICAL DATA: Coronary bypass, left chest tube removal

CHEST - 2 VIEW

[w chest pa]
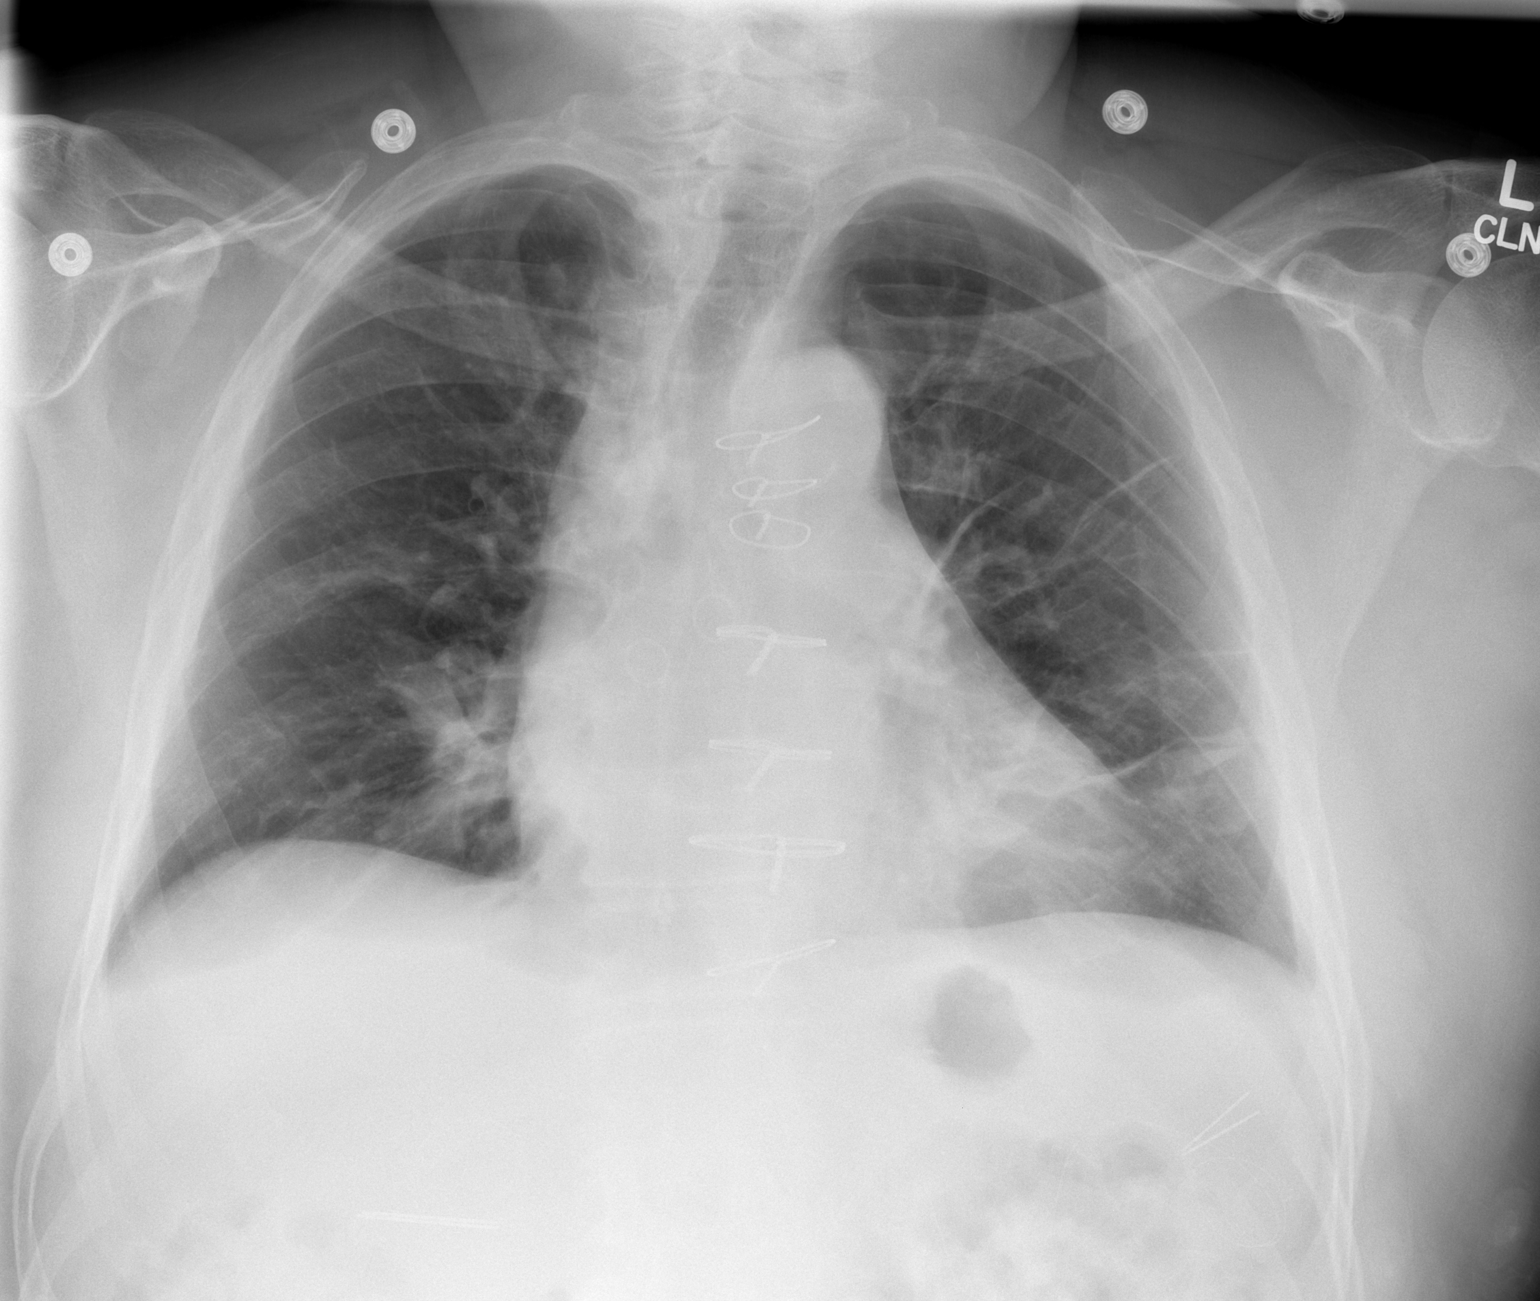

[w chest lat]
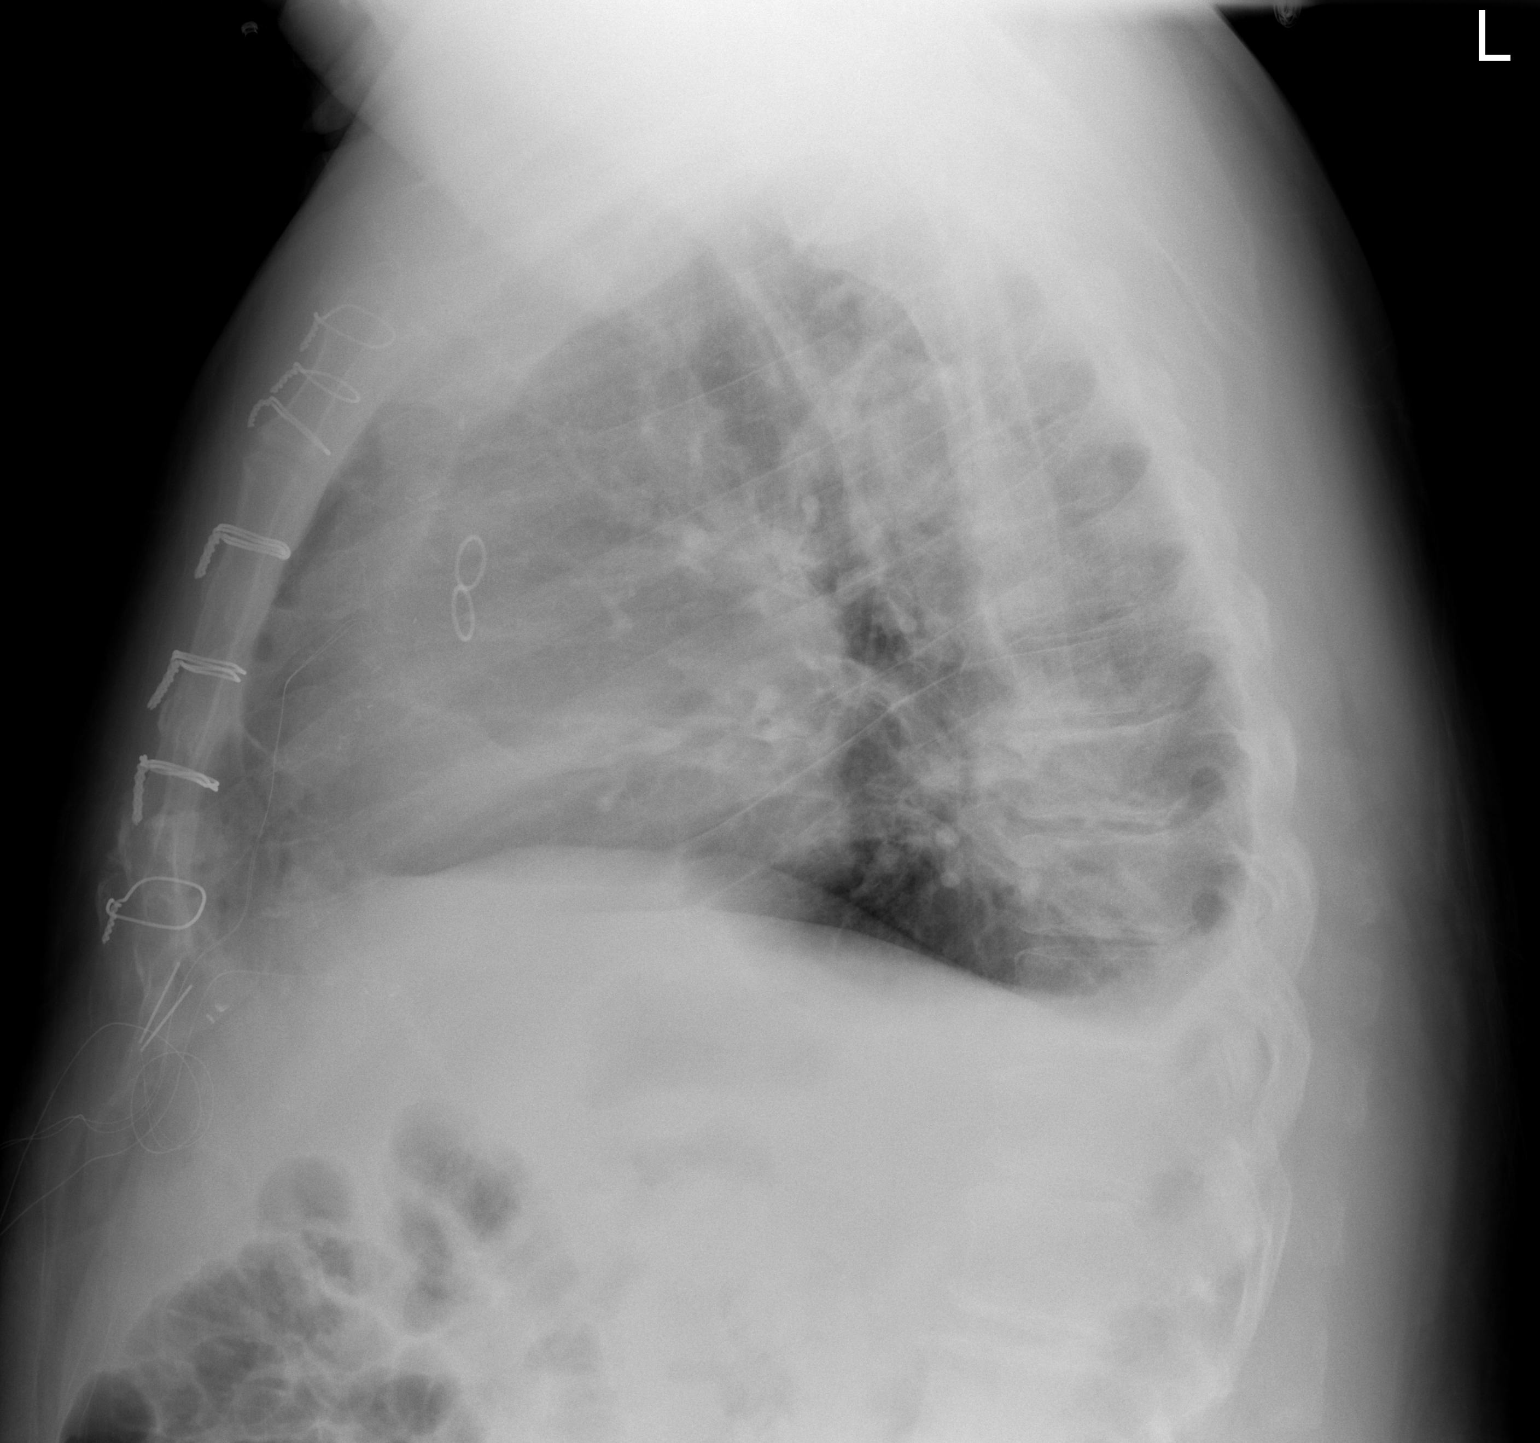

[2 of 2 positions shown; findings below may reference images not displayed]

FINDINGS: Interval left chest tube removal.  Previous coronary
bypass changes noted.  Epicardial pacing wires overlie the upper
abdomen.  Stable cardiac enlargement with scattered areas of
atelectasis, worse in the left lung.  Slight improvement in lung
volumes.  Trace pleural effusions noted.  No pneumothorax.
IMPRESSION: Stable chest exam following chest tube removal.  No pneumothorax.
Residual atelectasis.

## 2011-12-30 ENCOUNTER — Other Ambulatory Visit: Payer: Self-pay | Admitting: Cardiology

## 2011-12-30 MED ORDER — ROSUVASTATIN CALCIUM 40 MG PO TABS: 40.0000 mg | ORAL_TABLET | Freq: Every day | ORAL | Status: DC

## 2012-01-01 ENCOUNTER — Other Ambulatory Visit: Payer: Self-pay

## 2012-01-01 MED ORDER — LOSARTAN POTASSIUM 50 MG PO TABS: 50.0000 mg | ORAL_TABLET | Freq: Every day | ORAL | Status: DC

## 2012-04-23 ENCOUNTER — Other Ambulatory Visit: Payer: Self-pay | Admitting: Cardiology

## 2012-07-11 ENCOUNTER — Telehealth: Payer: Self-pay | Admitting: Cardiology

## 2012-07-11 NOTE — Telephone Encounter (Signed)
New Problem:    Patient called in unsure with how to proceed with his care since Dr. Daleen Squibb is retiring.  Please call back.

## 2012-07-11 NOTE — Telephone Encounter (Signed)
Spoke with patient who would like to know which cardiologist he will see when Dr. Clint Guy.  I advised patient that Dr. Daleen Squibb is not in the office today and that I will send him a message to ask him who patient should schedule his next upcoming appointment with as he is due for f/u in September.  Patient states he does not have a preference for who he sees and wants Dr. Daleen Squibb to refer him to someone that is "as good a doctor as he is."

## 2012-07-12 NOTE — Telephone Encounter (Signed)
LM for patient to call back to schedule appt with CM

## 2012-07-12 NOTE — Telephone Encounter (Signed)
Lets schedule him [place]. Thanks

## 2012-07-20 NOTE — Telephone Encounter (Signed)
According to appointments pt has appointment with Dr. [place] in September Mylo Red RN

## 2012-08-18 ENCOUNTER — Other Ambulatory Visit: Payer: Self-pay | Admitting: Cardiology

## 2012-10-14 ENCOUNTER — Other Ambulatory Visit: Payer: Self-pay

## 2012-10-14 MED ORDER — FUROSEMIDE 80 MG PO TABS: ORAL_TABLET | ORAL | Status: DC

## 2012-10-14 MED ORDER — LOSARTAN POTASSIUM 50 MG PO TABS: ORAL_TABLET | ORAL | Status: DC

## 2012-10-26 ENCOUNTER — Encounter: Payer: Self-pay | Admitting: Cardiology

## 2012-10-26 ENCOUNTER — Ambulatory Visit (INDEPENDENT_AMBULATORY_CARE_PROVIDER_SITE_OTHER): Payer: No Typology Code available for payment source | Admitting: Cardiology

## 2012-10-26 VITALS — BP 120/80 | HR 100 | Ht 68.0 in | Wt 233.0 lb

## 2012-10-26 DIAGNOSIS — I10 Essential (primary) hypertension: Secondary | ICD-10-CM

## 2012-10-26 DIAGNOSIS — E785 Hyperlipidemia, unspecified: Secondary | ICD-10-CM

## 2012-10-26 DIAGNOSIS — I25810 Atherosclerosis of coronary artery bypass graft(s) without angina pectoris: Secondary | ICD-10-CM

## 2012-10-26 DIAGNOSIS — Z86711 Personal history of pulmonary embolism: Secondary | ICD-10-CM

## 2012-10-26 DIAGNOSIS — I2510 Atherosclerotic heart disease of native coronary artery without angina pectoris: Secondary | ICD-10-CM

## 2012-10-26 DIAGNOSIS — I4891 Unspecified atrial fibrillation: Secondary | ICD-10-CM

## 2012-10-26 DIAGNOSIS — I252 Old myocardial infarction: Secondary | ICD-10-CM
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
## 2012-10-26 NOTE — Patient Instructions (Signed)
Continue your current therapy  I will request your labs from Dr. Renae Gloss  Get active. Try and get 30 minutes of exercise a day.  I will see you in one year.

## 2013-04-13 ENCOUNTER — Other Ambulatory Visit: Payer: Self-pay | Admitting: Cardiology

## 2013-07-28 ENCOUNTER — Ambulatory Visit (INDEPENDENT_AMBULATORY_CARE_PROVIDER_SITE_OTHER): Payer: Medicare HMO | Admitting: Emergency Medicine

## 2013-07-28 ENCOUNTER — Ambulatory Visit (INDEPENDENT_AMBULATORY_CARE_PROVIDER_SITE_OTHER): Payer: Medicare HMO

## 2013-07-28 VITALS — BP 118/60 | HR 97 | Temp 97.8°F | Resp 14 | Ht 67.0 in | Wt 225.4 lb

## 2013-07-28 DIAGNOSIS — R05 Cough: Secondary | ICD-10-CM

## 2013-07-28 DIAGNOSIS — E119 Type 2 diabetes mellitus without complications: Secondary | ICD-10-CM

## 2013-07-28 DIAGNOSIS — R059 Cough, unspecified: Secondary | ICD-10-CM

## 2013-07-28 LAB — CBC WITH DIFFERENTIAL/PLATELET
HCT: 37.5 % — ABNORMAL LOW (ref 39.0–52.0)
HEMOGLOBIN: 11.8 g/dL — AB (ref 13.0–17.0)
Lymphocytes Relative: 34 % (ref 12–46)
Lymphs Abs: 2.8 10*3/uL (ref 0.7–4.0)
MCH: 26.3 pg (ref 26.0–34.0)
MCHC: 31.5 g/dL (ref 30.0–36.0)
MCV: 83.5 fL (ref 78.0–100.0)
MONOS PCT: 10 % (ref 3–12)
Monocytes Absolute: 0.8 10*3/uL (ref 0.1–1.0)
NEUTROS PCT: 57 % (ref 43–77)
Neutro Abs: 4.8 10*3/uL (ref 1.7–7.7)
Platelets: 226 10*3/uL (ref 150–400)
RBC: 4.5 MIL/uL (ref 4.22–5.81)
RDW: 12.2 % (ref 11.5–15.5)
WBC: 8.4 10*3/uL (ref 4.0–10.5)

## 2013-07-28 LAB — GLUCOSE, POCT (MANUAL RESULT ENTRY)

## 2013-07-28 MED ORDER — LEVOFLOXACIN 500 MG PO TABS: 500.0000 mg | ORAL_TABLET | Freq: Every day | ORAL | Status: DC

## 2013-07-28 MED ORDER — HYDROCODONE-ACETAMINOPHEN 5-325 MG PO TABS: 1.0000 | ORAL_TABLET | Freq: Four times a day (QID) | ORAL | Status: DC | PRN

## 2013-07-28 NOTE — Patient Instructions (Signed)
Bronchitis  Bronchitis is inflammation of the airways that extend from the windpipe into the lungs (bronchi). The inflammation often causes mucus to develop, which leads to a cough. If the inflammation becomes severe, it may cause shortness of breath.  CAUSES   Bronchitis may be caused by:    Viral infections.    Bacteria.    Cigarette smoke.    Allergens, pollutants, and other irritants.   SIGNS AND SYMPTOMS   The most common symptom of bronchitis is a frequent cough that produces mucus. Other symptoms include:   Fever.    Body aches.    Chest congestion.    Chills.    Shortness of breath.    Sore throat.   DIAGNOSIS   Bronchitis is usually diagnosed through a medical history and physical exam. Tests, such as chest X-rays, are sometimes done to rule out other conditions.   TREATMENT   You may need to avoid contact with whatever caused the problem (smoking, for example). Medicines are sometimes needed. These may include:   Antibiotics. These may be prescribed if the condition is caused by bacteria.   Cough suppressants. These may be prescribed for relief of cough symptoms.    Inhaled medicines. These may be prescribed to help open your airways and make it easier for you to breathe.    Steroid medicines. These may be prescribed for those with recurrent (chronic) bronchitis.  HOME CARE INSTRUCTIONS   Get plenty of rest.    Drink enough fluids to keep your urine clear or pale yellow (unless you have a medical condition that requires fluid restriction). Increasing fluids may help thin your secretions and will prevent dehydration.    Only take over-the-counter or prescription medicines as directed by your health care Patrick Massey.   Only take antibiotics as directed. Make sure you finish them even if you start to feel better.   Avoid secondhand smoke, irritating chemicals, and strong fumes. These will make bronchitis worse. If you are a smoker, quit smoking. Consider using nicotine gum or  skin patches to help control withdrawal symptoms. Quitting smoking will help your lungs heal faster.    Put a cool-mist humidifier in your bedroom at night to moisten the air. This may help loosen mucus. Change the water in the humidifier daily. You can also run the hot water in your shower and sit in the bathroom with the door closed for 5-10 minutes.    Follow up with your health care Patrick Massey as directed.    Wash your hands frequently to avoid catching bronchitis again or spreading an infection to others.   SEEK MEDICAL CARE IF:  Your symptoms do not improve after 1 week of treatment.   SEEK IMMEDIATE MEDICAL CARE IF:   Your fever increases.   You have chills.    You have chest pain.    You have worsening shortness of breath.    You have bloody sputum.   You faint.   You have lightheadedness.   You have a severe headache.    You vomit repeatedly.  MAKE SURE YOU:    Understand these instructions.   Will watch your condition.   Will get help right away if you are not doing well or get worse.  Document Released: 01/19/2005 Document Revised: 11/09/2012 Document Reviewed: 09/13/2012  Exit[employer] Patient Information 2015 [employer]. This information is not intended to replace advice given to you by your health care Patrick Massey. Make sure you discuss any questions you have with your health care   Patrick Massey.
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
## 2013-11-15 ENCOUNTER — Ambulatory Visit: Payer: No Typology Code available for payment source | Admitting: Cardiology

## 2013-12-27 ENCOUNTER — Other Ambulatory Visit: Payer: Self-pay | Admitting: *Deleted

## 2013-12-27 MED ORDER — LOSARTAN POTASSIUM 50 MG PO TABS: 50.0000 mg | ORAL_TABLET | Freq: Every day | ORAL | Status: DC

## 2014-01-04 ENCOUNTER — Encounter: Payer: Self-pay | Admitting: Physician Assistant

## 2014-01-04 ENCOUNTER — Ambulatory Visit (INDEPENDENT_AMBULATORY_CARE_PROVIDER_SITE_OTHER): Payer: Medicare HMO | Admitting: Physician Assistant

## 2014-01-04 VITALS — BP 140/90 | Ht 68.0 in | Wt 235.9 lb

## 2014-01-04 DIAGNOSIS — E785 Hyperlipidemia, unspecified: Secondary | ICD-10-CM

## 2014-01-04 DIAGNOSIS — I2583 Coronary atherosclerosis due to lipid rich plaque: Secondary | ICD-10-CM

## 2014-01-04 DIAGNOSIS — E669 Obesity, unspecified: Secondary | ICD-10-CM

## 2014-01-04 DIAGNOSIS — E1169 Type 2 diabetes mellitus with other specified complication: Secondary | ICD-10-CM | POA: Insufficient documentation

## 2014-01-04 DIAGNOSIS — I5032 Chronic diastolic (congestive) heart failure: Secondary | ICD-10-CM

## 2014-01-04 DIAGNOSIS — E119 Type 2 diabetes mellitus without complications: Secondary | ICD-10-CM

## 2014-01-04 DIAGNOSIS — I480 Paroxysmal atrial fibrillation: Secondary | ICD-10-CM

## 2014-01-04 DIAGNOSIS — I2510 Atherosclerotic heart disease of native coronary artery without angina pectoris: Secondary | ICD-10-CM

## 2014-01-04 NOTE — Progress Notes (Addendum)
Patient ID: Patrick Massey, male   DOB: 17-Sep-1940, 73 y.o.   MRN: [id number]    Date:  01/04/2014   ID:  Patrick Massey, DOB [birth year], MRN [id number]  PCP:  Maximino Greenland, MD  Primary Cardiologist:  [place]    History of Present Illness: Patrick Massey is a 73 y.o. male who presented with a myocardial infarction in June of 2011. Cardiac catheterization demonstrated tandem critical stenoses in the left circumflex. He also had moderate to severe left main disease. He underwent coronary bypass surgery by Dr. Servando Snare. His postoperative course was complicated by paroxysmal atrial fibrillation. After discharge he was readmitted with DVT of the right lower extremity and a pulmonary embolus. He was anticoagulated for 6 months. Since then he has done very well.  He does have some chronic edema/diastolic heart failure for which he takes Lasix.  Patient states he bowls on a regular basis. And is very active. He has had some periodic left arm pain which is worse after sleeping while lying on his arm. It is not associated with exertion. It does occur just about every day.  He did apparently have some lower extremity arterial Dopplers which apparently had good ABIs.    The patient currently denies nausea, vomiting, fever, chest pain, shortness of breath, orthopnea, dizziness, PND, cough, congestion, abdominal pain, hematochezia, melena, lower extremity edema, claudication.  Labs from 07/17/2013  Hemoglobin A1c 11.8 Vitamin D 16 total cholesterol 298, HDL 54, LDL 195, triglycerides 246, VLDL 49   Wt Readings from Last 3 Encounters:  01/04/14 235 lb 14.4 oz (107.004 kg)  07/28/13 225 lb 6.4 oz (102.241 kg)  10/26/12 233 lb (105.688 kg)     Past Medical History  Diagnosis Date  . Hyperlipidemia   . Diabetes mellitus   . Hypertension   . CAD (coronary artery disease)   . Old MI (myocardial infarction)   . Pulmonary embolus 2010    post CABG  . DVT (deep venous thrombosis) 2010    post CABG  .  Arthritis   . High cholesterol     Current Outpatient Prescriptions  Medication Sig Dispense Refill  . aspirin 325 MG tablet Take 325 mg by mouth daily.      . furosemide (LASIX) 80 MG tablet TAKE 1 TABLET (80 MG TOTAL) BY MOUTH DAILY. 90 tablet 1  . glimepiride (AMARYL) 4 MG tablet Take 1 tablet by mouth Twice daily.    Marland Kitchen KLOR-CON M20 20 MEQ tablet TAKE 1 TABLET (20 MEQ TOTAL) BY MOUTH DAILY. 30 tablet 9  . losartan (COZAAR) 50 MG tablet Take 1 tablet (50 mg total) by mouth daily. 90 tablet 0  . metFORMIN (GLUCOPHAGE-XR) 750 MG 24 hr tablet Take 750 mg by mouth daily with breakfast.      . ONETOUCH VERIO test strip   5  . psyllium (METAMUCIL) 58.6 % powder Take 1 packet by mouth 3 (three) times daily.      . rosuvastatin (CRESTOR) 40 MG tablet Take 1 tablet (40 mg total) by mouth daily. 30 tablet 8  . sildenafil (VIAGRA) 50 MG tablet Take 50 mg by mouth daily as needed.       No current facility-administered medications for this visit.    Allergies:    Allergies  Allergen Reactions  . Heparin     REACTION: platelets decreased    Social History:  The patient  reports that he quit smoking about 53 years ago. He does not have any smokeless tobacco history  on file. He reports that he does not drink alcohol or use illicit drugs.   Family history:   Family History  Problem Relation Age of Onset  . Brain cancer Mother     died age 65  . Heart attack Father     age 42    ROS:  Please see the history of present illness.  All other systems reviewed and negative.   PHYSICAL EXAM: VS:  BP 140/90 mmHg  Ht 5\' 8"  (1.727 m)  Wt 235 lb 14.4 oz (107.004 kg)  BMI 35.88 kg/m2 Obese, well developed, in no acute distress HEENT: Pupils are equal round react to light accommodation extraocular movements are intact.  Neck: no JVDNo cervical lymphadenopathy. Cardiac: Regular rate and rhythm without murmurs rubs or gallops. Lungs:  clear to auscultation bilaterally, no wheezing, rhonchi or  rales Abd: soft, nontender, positive bowel sounds all quadrants, no hepatosplenomegaly Ext: Trace lower extremity edema.  2+ radial and dorsalis pedis pulses. Skin: warm and dry Neuro:  Grossly normal  EKG:  Normal sinus rhythm. Small inferior Q waves. Rate 81 bpm.  ASSESSMENT AND PLAN:  Problem List Items Addressed This Visit    Coronary artery disease    No complaints of angina.      Relevant Orders      EKG 12-Lead   Diabetes mellitus type 2 in obese    This patient is started on insulin. We'll copy primary care provider.    DIASTOLIC HEART FAILURE, CHRONIC    Appears euvolemic. Continue Lasix 80 mg daily.    Dyslipidemia - Primary    Patient reports he had his cholesterol checked about 1 month ago. Values listed above. Severely elevated he is on a statin but needs his drastic dietary changes. Dietary referral has been made.    Obesity (BMI 30-39.9)    We discussed referral to a registered dietitian. The patient appears interested. Contact information provided.    PAROXYSMAL ATRIAL FIBRILLATION    Maintaining normal sinus rhythm

## 2014-01-04 NOTE — Assessment & Plan Note (Signed)
No complaints of angina. 

## 2014-01-04 NOTE — Assessment & Plan Note (Addendum)
Patient reports he had his cholesterol checked about 1 month ago. Values listed above. Severely elevated he is on a statin but needs his drastic dietary changes. Dietary referral has been made.

## 2014-01-04 NOTE — Assessment & Plan Note (Signed)
Maintaining normal sinus rhythm 

## 2014-01-04 NOTE — Assessment & Plan Note (Signed)
Appears euvolemic. Continue Lasix 80 mg daily.

## 2014-01-04 NOTE — Assessment & Plan Note (Signed)
We discussed referral to a registered dietitian. The patient appears interested. Contact information provided.

## 2014-01-04 NOTE — Patient Instructions (Signed)
Your physician recommends that you schedule a follow-up appointment in: 6 months with Dr. Jordan 

## 2014-01-04 NOTE — Assessment & Plan Note (Signed)
This patient is restarted on insulin. We'll copy primary care provider.

## 2014-05-04 ENCOUNTER — Other Ambulatory Visit: Payer: Self-pay

## 2014-05-04 MED ORDER — LOSARTAN POTASSIUM 50 MG PO TABS: 50.0000 mg | ORAL_TABLET | Freq: Every day | ORAL | Status: DC

## 2014-07-28 ENCOUNTER — Other Ambulatory Visit: Payer: Self-pay | Admitting: Cardiology

## 2014-08-14 ENCOUNTER — Ambulatory Visit (INDEPENDENT_AMBULATORY_CARE_PROVIDER_SITE_OTHER): Payer: Medicare HMO | Admitting: Cardiology

## 2014-08-14 ENCOUNTER — Encounter: Payer: Self-pay | Admitting: Cardiology

## 2014-08-14 VITALS — BP 150/82 | HR 74 | Ht 68.0 in | Wt 226.1 lb

## 2014-08-14 DIAGNOSIS — I10 Essential (primary) hypertension: Secondary | ICD-10-CM

## 2014-08-14 DIAGNOSIS — E1169 Type 2 diabetes mellitus with other specified complication: Secondary | ICD-10-CM

## 2014-08-14 DIAGNOSIS — I25810 Atherosclerosis of coronary artery bypass graft(s) without angina pectoris: Secondary | ICD-10-CM | POA: Diagnosis not present

## 2014-08-14 DIAGNOSIS — E785 Hyperlipidemia, unspecified: Secondary | ICD-10-CM

## 2014-08-14 DIAGNOSIS — E119 Type 2 diabetes mellitus without complications: Secondary | ICD-10-CM | POA: Diagnosis not present

## 2014-08-14 DIAGNOSIS — E669 Obesity, unspecified: Secondary | ICD-10-CM

## 2014-08-14 MED ORDER — ATORVASTATIN CALCIUM 80 MG PO TABS: 80.0000 mg | ORAL_TABLET | Freq: Every day | ORAL | Status: DC

## 2014-08-14 NOTE — Patient Instructions (Signed)
We will switch your cholesterol medication to lipitor 80 mg daily  Stop pravastatin and Crestor.  Continue your other therapy  I will see you in 6 months.
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
## 2014-11-07 DIAGNOSIS — I10 Essential (primary) hypertension: Secondary | ICD-10-CM | POA: Diagnosis not present

## 2014-11-07 DIAGNOSIS — I2510 Atherosclerotic heart disease of native coronary artery without angina pectoris: Secondary | ICD-10-CM | POA: Diagnosis not present

## 2014-11-07 DIAGNOSIS — Z23 Encounter for immunization: Secondary | ICD-10-CM | POA: Diagnosis not present

## 2014-11-07 DIAGNOSIS — I119 Hypertensive heart disease without heart failure: Secondary | ICD-10-CM | POA: Diagnosis not present

## 2014-11-07 DIAGNOSIS — Z1389 Encounter for screening for other disorder: Secondary | ICD-10-CM | POA: Diagnosis not present

## 2014-11-07 DIAGNOSIS — E785 Hyperlipidemia, unspecified: Secondary | ICD-10-CM | POA: Diagnosis not present

## 2014-11-07 DIAGNOSIS — E1165 Type 2 diabetes mellitus with hyperglycemia: Secondary | ICD-10-CM | POA: Diagnosis not present

## 2014-11-13 ENCOUNTER — Other Ambulatory Visit: Payer: Self-pay | Admitting: Cardiology

## 2014-11-13 NOTE — Telephone Encounter (Signed)
REFILL 

## 2014-11-24 ENCOUNTER — Ambulatory Visit (INDEPENDENT_AMBULATORY_CARE_PROVIDER_SITE_OTHER): Payer: Self-pay | Admitting: Emergency Medicine

## 2014-11-24 VITALS — BP 180/102 | HR 99 | Temp 97.8°F | Resp 18 | Ht 67.0 in | Wt 225.0 lb

## 2014-11-24 DIAGNOSIS — Z021 Encounter for pre-employment examination: Secondary | ICD-10-CM

## 2014-11-24 DIAGNOSIS — Z024 Encounter for examination for driving license: Secondary | ICD-10-CM

## 2014-11-24 NOTE — Progress Notes (Signed)
Subjective:  Patient ID: Patrick Massey, male    DOB: April 22, 1940  Age: 74 y.o. MRN: [id number]  CC: Employment Physical   HPI Patrick Massey presents  for DOT physical he has a history of hypertension that's not well controlled. He is not declared a history of coronary artery disease MI or diabetes on his DOT form  History Patrick Massey has a past medical history of Hyperlipidemia; Diabetes mellitus; Hypertension; CAD (coronary artery disease); Old MI (myocardial infarction); Pulmonary embolus ([place]) (2010); DVT (deep venous thrombosis) ([place]) (2010); Arthritis; and High cholesterol.   He has past surgical history that includes Coronary artery bypass graft; Skin graft; Hemorrhoid surgery; Myocardial perfusion scan; and Cardiovascular stress test.   His  family history includes Brain cancer in his mother; Heart attack in his father.  He   reports that he quit smoking about 54 years ago. He does not have any smokeless tobacco history on file. He reports that he does not drink alcohol or use illicit drugs.  Outpatient Prescriptions Prior to Visit  Medication Sig Dispense Refill  . aspirin 325 MG tablet Take 325 mg by mouth daily.      Marland Kitchen atorvastatin (LIPITOR) 80 MG tablet Take 1 tablet (80 mg total) by mouth daily. 90 tablet 3  . furosemide (LASIX) 80 MG tablet TAKE 1 TABLET (80 MG TOTAL) BY MOUTH DAILY. 90 tablet 1  . KLOR-CON M20 20 MEQ tablet TAKE 1 TABLET (20 MEQ TOTAL) BY MOUTH DAILY. 30 tablet 9  . losartan (COZAAR) 50 MG tablet TAKE 1 TABLET (50 MG TOTAL) BY MOUTH DAILY. 90 tablet 1  . metFORMIN (GLUCOPHAGE) 1000 MG tablet Take 1 tablet by mouth 3 (three) times daily. Take 1 tab in the morning and 2 tabs in the evening  2  . ONETOUCH VERIO test strip   5  . psyllium (METAMUCIL) 58.6 % powder Take 1 packet by mouth 3 (three) times daily.      . Vitamin D, Ergocalciferol, (DRISDOL) 50000 UNITS CAPS capsule Take 1 capsule by mouth 2 (two) times a week. Take 1 cap twice a week  1  .  glimepiride (AMARYL) 4 MG tablet Take 1 tablet by mouth Twice daily.    . metFORMIN (GLUCOPHAGE-XR) 750 MG 24 hr tablet Take 750 mg by mouth daily with breakfast.      . sildenafil (VIAGRA) 50 MG tablet Take 50 mg by mouth daily as needed.       No facility-administered medications prior to visit.    Social History   Social History  . Marital Status: Married    Spouse Name: N/A  . Number of Children: 1  . Years of Education: N/A   Occupational History  . truck driver    Social History Main Topics  . Smoking status: Former Smoker    Quit date: 07/30/1960  . Smokeless tobacco: None  . Alcohol Use: No  . Drug Use: No  . Sexual Activity: Not Asked   Other Topics Concern  . 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 Denies alcohol use. He does have three sisters and eight brothers all younger with no known health problems.     Review of Systems  Constitutional: Negative for fever, chills and appetite change.  HENT: Negative for congestion, ear pain, postnasal drip, sinus pressure and sore throat.   Eyes: Negative for pain and redness.  Respiratory: Negative for cough, shortness of  breath and wheezing.   Cardiovascular: Negative for leg swelling.  Gastrointestinal: Negative for nausea, vomiting, abdominal pain, diarrhea, constipation and blood in stool.  Endocrine: Negative for polyuria.  Genitourinary: Negative for dysuria, urgency, frequency and flank pain.  Musculoskeletal: Negative for gait problem.  Skin: Negative for rash.  Neurological: Negative for weakness and headaches.  Psychiatric/Behavioral: Negative for confusion and decreased concentration. The patient is not nervous/anxious.     Objective:  BP 180/102 mmHg  Pulse 99  Temp(Src) 97.8 F (36.6 C) (Oral)  Resp 18  Ht 5\' 7"  (1.702 m)  Wt 225 lb (102.059 kg)  BMI 35.23 kg/m2  SpO2 96%  Physical Exam  Constitutional: He is oriented to  person, place, and time. He appears well-developed and well-nourished. No distress.  HENT:  Head: Normocephalic and atraumatic.  Right Ear: External ear normal.  Left Ear: External ear normal.  Nose: Nose normal.  Eyes: Conjunctivae and EOM are normal. Pupils are equal, round, and reactive to light. No scleral icterus.  Neck: Normal range of motion. Neck supple. No tracheal deviation present.  Cardiovascular: Normal rate, regular rhythm and normal heart sounds.   Pulmonary/Chest: Effort normal. No respiratory distress. He has no wheezes. He has no rales.  Abdominal: He exhibits no mass. There is no tenderness. There is no rebound and no guarding.  Musculoskeletal: He exhibits no edema.  Lymphadenopathy:    He has no cervical adenopathy.  Neurological: He is alert and oriented to person, place, and time. Coordination normal.  Skin: Skin is warm and dry. No rash noted.  Psychiatric: He has a normal mood and affect. His behavior is normal.      Assessment & Plan:   There are no diagnoses linked to this encounter. I am having Patrick Massey maintain his aspirin, psyllium, metFORMIN, sildenafil, glimepiride, KLOR-CON M20, furosemide, ONETOUCH VERIO, Vitamin D (Ergocalciferol), metFORMIN, atorvastatin, and losartan.  No orders of the defined types were placed in this encounter.     he was given a 3 month DOT certificate based on his blood pressure of 160/84 when I took it. I told him back in 3 months and be reevaluated in the meantime is going to see his regular doctor I think the diabetes issue and the coronary artery disease issue disclosed on the computer chart need to be reviewed on a DOT evaluation as well   Appropriate red flag conditions were discussed with the patient as well as actions that should be taken.  Patient expressed his understanding.  Follow-up: No Follow-up on file.  Roselee Culver, MD

## 2015-02-13 DIAGNOSIS — I10 Essential (primary) hypertension: Secondary | ICD-10-CM | POA: Diagnosis not present

## 2015-02-13 DIAGNOSIS — E1165 Type 2 diabetes mellitus with hyperglycemia: Secondary | ICD-10-CM | POA: Diagnosis not present

## 2015-02-13 DIAGNOSIS — E785 Hyperlipidemia, unspecified: Secondary | ICD-10-CM | POA: Diagnosis not present

## 2015-02-13 DIAGNOSIS — Z9114 Patient's other noncompliance with medication regimen: Secondary | ICD-10-CM | POA: Diagnosis not present

## 2015-02-13 DIAGNOSIS — R69 Illness, unspecified: Secondary | ICD-10-CM | POA: Diagnosis not present

## 2015-02-13 DIAGNOSIS — I119 Hypertensive heart disease without heart failure: Secondary | ICD-10-CM | POA: Diagnosis not present

## 2015-02-20 ENCOUNTER — Telehealth: Payer: Self-pay | Admitting: Cardiology

## 2015-02-20 NOTE — Telephone Encounter (Signed)
Ms. Patrick Massey call requesting that pt's pharmacy be switched to [facility] instead of [facility], because pt's medications are cheaper. Call pt and left message informing pt of the request and if he could give our office a call back if there were any other problems.

## 2015-02-21 ENCOUNTER — Ambulatory Visit (INDEPENDENT_AMBULATORY_CARE_PROVIDER_SITE_OTHER): Payer: Self-pay | Admitting: Family Medicine

## 2015-02-21 VITALS — BP 166/96 | HR 111 | Temp 98.0°F | Resp 17 | Ht 67.5 in | Wt 235.0 lb

## 2015-02-21 DIAGNOSIS — Z0000 Encounter for general adult medical examination without abnormal findings: Secondary | ICD-10-CM

## 2015-02-21 DIAGNOSIS — Z021 Encounter for pre-employment examination: Secondary | ICD-10-CM
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
## 2015-02-27 ENCOUNTER — Ambulatory Visit (INDEPENDENT_AMBULATORY_CARE_PROVIDER_SITE_OTHER): Payer: Medicare HMO | Admitting: Cardiology

## 2015-02-27 ENCOUNTER — Encounter: Payer: Self-pay | Admitting: Cardiology

## 2015-02-27 VITALS — BP 150/86 | HR 87 | Ht 68.0 in | Wt 234.0 lb

## 2015-02-27 DIAGNOSIS — E119 Type 2 diabetes mellitus without complications: Secondary | ICD-10-CM | POA: Diagnosis not present

## 2015-02-27 DIAGNOSIS — I10 Essential (primary) hypertension: Secondary | ICD-10-CM

## 2015-02-27 DIAGNOSIS — I25810 Atherosclerosis of coronary artery bypass graft(s) without angina pectoris: Secondary | ICD-10-CM

## 2015-02-27 DIAGNOSIS — E785 Hyperlipidemia, unspecified: Secondary | ICD-10-CM

## 2015-02-27 DIAGNOSIS — E1169 Type 2 diabetes mellitus with other specified complication: Secondary | ICD-10-CM

## 2015-02-27 DIAGNOSIS — E669 Obesity, unspecified: Secondary | ICD-10-CM
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
## 2015-02-27 NOTE — Patient Instructions (Signed)
Increase losartan to 100 mg daily  We will schedule you for a stress nuclear study.  We will request a copy of your labs from Dr. Baird Cancer.

## 2015-02-28 ENCOUNTER — Other Ambulatory Visit: Payer: Self-pay

## 2015-02-28 DIAGNOSIS — I252 Old myocardial infarction: Secondary | ICD-10-CM

## 2015-02-28 DIAGNOSIS — I25810 Atherosclerosis of coronary artery bypass graft(s) without angina pectoris: Secondary | ICD-10-CM

## 2015-02-28 MED ORDER — ATORVASTATIN CALCIUM 80 MG PO TABS: 80.0000 mg | ORAL_TABLET | Freq: Every day | ORAL | Status: DC

## 2015-02-28 MED ORDER — LOSARTAN POTASSIUM 100 MG PO TABS: 100.0000 mg | ORAL_TABLET | Freq: Every day | ORAL | Status: DC

## 2015-02-28 MED ORDER — FUROSEMIDE 80 MG PO TABS: ORAL_TABLET | ORAL | Status: DC

## 2015-03-15 ENCOUNTER — Telehealth (HOSPITAL_COMMUNITY): Payer: Self-pay

## 2015-03-15 NOTE — Telephone Encounter (Signed)
Encounter complete. 

## 2015-03-18 DIAGNOSIS — R69 Illness, unspecified: Secondary | ICD-10-CM | POA: Diagnosis not present

## 2015-03-19 ENCOUNTER — Telehealth (HOSPITAL_COMMUNITY): Payer: Self-pay

## 2015-03-19 NOTE — Telephone Encounter (Signed)
Encounter complete. 

## 2015-03-20 ENCOUNTER — Ambulatory Visit (HOSPITAL_COMMUNITY)
Admission: RE | Admit: 2015-03-20 | Discharge: 2015-03-20 | Disposition: A | Discharge status: Home / Self Care | Payer: Medicare HMO | Source: Ambulatory Visit | Attending: Cardiovascular Disease | Admitting: Cardiovascular Disease

## 2015-03-20 DIAGNOSIS — I252 Old myocardial infarction: Secondary | ICD-10-CM | POA: Diagnosis not present

## 2015-03-20 DIAGNOSIS — Z87891 Personal history of nicotine dependence: Secondary | ICD-10-CM | POA: Diagnosis not present

## 2015-03-20 DIAGNOSIS — Z6835 Body mass index (BMI) 35.0-35.9, adult: Secondary | ICD-10-CM | POA: Insufficient documentation

## 2015-03-20 DIAGNOSIS — R9439 Abnormal result of other cardiovascular function study: Secondary | ICD-10-CM | POA: Insufficient documentation

## 2015-03-20 DIAGNOSIS — E119 Type 2 diabetes mellitus without complications: Secondary | ICD-10-CM | POA: Diagnosis not present

## 2015-03-20 DIAGNOSIS — E669 Obesity, unspecified: Secondary | ICD-10-CM | POA: Insufficient documentation

## 2015-03-20 DIAGNOSIS — I10 Essential (primary) hypertension: Secondary | ICD-10-CM | POA: Insufficient documentation

## 2015-03-20 DIAGNOSIS — Z8249 Family history of ischemic heart disease and other diseases of the circulatory system: Secondary | ICD-10-CM | POA: Diagnosis not present

## 2015-03-20 DIAGNOSIS — I25810 Atherosclerosis of coronary artery bypass graft(s) without angina pectoris: Secondary | ICD-10-CM | POA: Diagnosis not present

## 2015-03-20 LAB — MYOCARDIAL PERFUSION IMAGING
CHL RATE OF PERCEIVED EXERTION: 17
CSEPED: 4 min
CSEPEDS: 21 s
CSEPEW: 5.4 METS
CSEPPHR: 146 {beats}/min
LV dias vol: 123 mL
LVSYSVOL: 73 mL
MPHR: 146 {beats}/min
NUC STRESS TID: 1.06
Percent HR: 100 %
Rest HR: 69 {beats}/min
SDS: 2
SRS: 6
SSS: 8

## 2015-03-20 MED ORDER — TECHNETIUM TC 99M SESTAMIBI GENERIC - CARDIOLITE
11.0000 | Freq: Once | INTRAVENOUS | Status: AC | PRN
  Administered 2015-03-20: 11 via INTRAVENOUS

## 2015-03-20 MED ORDER — TECHNETIUM TC 99M SESTAMIBI GENERIC - CARDIOLITE
30.5000 | Freq: Once | INTRAVENOUS | Status: AC | PRN
  Administered 2015-03-20: 30.5 via INTRAVENOUS

## 2015-03-26 ENCOUNTER — Other Ambulatory Visit: Payer: Self-pay

## 2015-03-26 DIAGNOSIS — I252 Old myocardial infarction: Secondary | ICD-10-CM

## 2015-03-26 DIAGNOSIS — I5032 Chronic diastolic (congestive) heart failure: Secondary | ICD-10-CM

## 2015-04-04 DIAGNOSIS — N401 Enlarged prostate with lower urinary tract symptoms: Secondary | ICD-10-CM | POA: Diagnosis not present

## 2015-04-04 DIAGNOSIS — E291 Testicular hypofunction: Secondary | ICD-10-CM | POA: Diagnosis not present

## 2015-04-04 DIAGNOSIS — N5201 Erectile dysfunction due to arterial insufficiency: Secondary | ICD-10-CM | POA: Diagnosis not present

## 2015-04-04 DIAGNOSIS — Z0000 Encounter for general adult medical examination without abnormal findings: Secondary | ICD-10-CM | POA: Diagnosis not present

## 2015-04-04 DIAGNOSIS — R350 Frequency of micturition: Secondary | ICD-10-CM | POA: Diagnosis not present

## 2015-04-10 ENCOUNTER — Other Ambulatory Visit (HOSPITAL_COMMUNITY): Payer: Medicare HMO

## 2015-04-17 ENCOUNTER — Ambulatory Visit (HOSPITAL_COMMUNITY): Payer: Medicare HMO | Attending: Cardiology

## 2015-04-17 ENCOUNTER — Other Ambulatory Visit: Payer: Self-pay

## 2015-04-17 DIAGNOSIS — I252 Old myocardial infarction: Secondary | ICD-10-CM | POA: Diagnosis not present

## 2015-04-17 DIAGNOSIS — E119 Type 2 diabetes mellitus without complications: Secondary | ICD-10-CM | POA: Insufficient documentation

## 2015-04-17 DIAGNOSIS — I5032 Chronic diastolic (congestive) heart failure: Secondary | ICD-10-CM | POA: Diagnosis not present

## 2015-04-17 DIAGNOSIS — Z87891 Personal history of nicotine dependence: Secondary | ICD-10-CM | POA: Diagnosis not present

## 2015-04-17 DIAGNOSIS — E785 Hyperlipidemia, unspecified: Secondary | ICD-10-CM | POA: Diagnosis not present

## 2015-04-17 DIAGNOSIS — Z6835 Body mass index (BMI) 35.0-35.9, adult: Secondary | ICD-10-CM | POA: Insufficient documentation

## 2015-04-17 DIAGNOSIS — I110 Hypertensive heart disease with heart failure: Secondary | ICD-10-CM | POA: Diagnosis not present

## 2015-04-17 DIAGNOSIS — Z951 Presence of aortocoronary bypass graft: Secondary | ICD-10-CM | POA: Insufficient documentation

## 2015-04-17 DIAGNOSIS — I77810 Thoracic aortic ectasia: Secondary | ICD-10-CM | POA: Insufficient documentation

## 2015-04-17 DIAGNOSIS — E669 Obesity, unspecified: Secondary | ICD-10-CM | POA: Diagnosis not present

## 2015-04-17 DIAGNOSIS — I059 Rheumatic mitral valve disease, unspecified: Secondary | ICD-10-CM | POA: Diagnosis not present

## 2015-04-17 DIAGNOSIS — I351 Nonrheumatic aortic (valve) insufficiency: Secondary | ICD-10-CM | POA: Insufficient documentation

## 2015-04-17 MED ORDER — PERFLUTREN LIPID MICROSPHERE
1.0000 mL | INTRAVENOUS | Status: AC | PRN
  Administered 2015-04-17: 2 mL via INTRAVENOUS

## 2015-04-19 DIAGNOSIS — R69 Illness, unspecified: Secondary | ICD-10-CM | POA: Diagnosis not present

## 2015-04-27 DIAGNOSIS — E7800 Pure hypercholesterolemia, unspecified: Secondary | ICD-10-CM | POA: Diagnosis not present

## 2015-04-27 DIAGNOSIS — I509 Heart failure, unspecified: Secondary | ICD-10-CM | POA: Diagnosis not present

## 2015-04-27 DIAGNOSIS — I110 Hypertensive heart disease with heart failure: Secondary | ICD-10-CM | POA: Diagnosis not present

## 2015-04-27 DIAGNOSIS — E118 Type 2 diabetes mellitus with unspecified complications: Secondary | ICD-10-CM | POA: Diagnosis not present

## 2015-05-13 ENCOUNTER — Other Ambulatory Visit: Payer: Self-pay | Admitting: Cardiology

## 2015-05-13 NOTE — Telephone Encounter (Signed)
REFILL 

## 2015-05-16 DIAGNOSIS — E291 Testicular hypofunction: Secondary | ICD-10-CM | POA: Diagnosis not present

## 2015-07-29 ENCOUNTER — Ambulatory Visit: Payer: Medicare HMO | Admitting: Cardiology

## 2015-08-02 ENCOUNTER — Encounter: Payer: Self-pay | Admitting: Cardiology

## 2015-08-02 ENCOUNTER — Ambulatory Visit (INDEPENDENT_AMBULATORY_CARE_PROVIDER_SITE_OTHER): Payer: Medicare HMO | Admitting: Cardiology

## 2015-08-02 VITALS — BP 126/78 | HR 99 | Ht 67.0 in | Wt 237.2 lb

## 2015-08-02 DIAGNOSIS — E119 Type 2 diabetes mellitus without complications: Secondary | ICD-10-CM | POA: Diagnosis not present

## 2015-08-02 DIAGNOSIS — E1169 Type 2 diabetes mellitus with other specified complication: Secondary | ICD-10-CM

## 2015-08-02 DIAGNOSIS — I25810 Atherosclerosis of coronary artery bypass graft(s) without angina pectoris: Secondary | ICD-10-CM | POA: Diagnosis not present

## 2015-08-02 DIAGNOSIS — E785 Hyperlipidemia, unspecified: Secondary | ICD-10-CM | POA: Diagnosis not present

## 2015-08-02 DIAGNOSIS — I10 Essential (primary) hypertension: Secondary | ICD-10-CM

## 2015-08-02 DIAGNOSIS — E669 Obesity, unspecified: Secondary | ICD-10-CM

## 2015-08-02 MED ORDER — ASPIRIN EC 81 MG PO TBEC: 81.0000 mg | DELAYED_RELEASE_TABLET | Freq: Every day | ORAL | Status: DC

## 2015-08-02 NOTE — Patient Instructions (Signed)
Reduce ASA to 81 mg daily  Follow up lab work with Dr. Baird Cancer.  Lose weight and exercise  I will see you in 6 months.
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
## 2015-08-27 DIAGNOSIS — E291 Testicular hypofunction: Secondary | ICD-10-CM | POA: Diagnosis not present

## 2015-08-27 DIAGNOSIS — R972 Elevated prostate specific antigen [PSA]: Secondary | ICD-10-CM | POA: Diagnosis not present

## 2015-08-27 DIAGNOSIS — N5201 Erectile dysfunction due to arterial insufficiency: Secondary | ICD-10-CM | POA: Diagnosis not present

## 2015-11-12 ENCOUNTER — Other Ambulatory Visit: Payer: Self-pay

## 2015-11-12 MED ORDER — LOSARTAN POTASSIUM 100 MG PO TABS: 100.0000 mg | ORAL_TABLET | Freq: Every day | ORAL | 0 refills | Status: DC

## 2015-11-18 ENCOUNTER — Other Ambulatory Visit: Payer: Self-pay

## 2015-11-27 DIAGNOSIS — Z23 Encounter for immunization: Secondary | ICD-10-CM | POA: Diagnosis not present

## 2015-11-27 DIAGNOSIS — N08 Glomerular disorders in diseases classified elsewhere: Secondary | ICD-10-CM | POA: Diagnosis not present

## 2015-11-27 DIAGNOSIS — I1310 Hypertensive heart and chronic kidney disease without heart failure, with stage 1 through stage 4 chronic kidney disease, or unspecified chronic kidney disease: Secondary | ICD-10-CM | POA: Diagnosis not present

## 2015-11-27 DIAGNOSIS — E1122 Type 2 diabetes mellitus with diabetic chronic kidney disease: Secondary | ICD-10-CM | POA: Diagnosis not present

## 2015-11-27 DIAGNOSIS — E559 Vitamin D deficiency, unspecified: Secondary | ICD-10-CM | POA: Diagnosis not present

## 2015-11-27 DIAGNOSIS — R5383 Other fatigue: Secondary | ICD-10-CM | POA: Diagnosis not present

## 2015-11-27 DIAGNOSIS — N182 Chronic kidney disease, stage 2 (mild): Secondary | ICD-10-CM | POA: Diagnosis not present

## 2015-11-27 DIAGNOSIS — I2510 Atherosclerotic heart disease of native coronary artery without angina pectoris: Secondary | ICD-10-CM | POA: Diagnosis not present

## 2015-12-11 DIAGNOSIS — R194 Change in bowel habit: Secondary | ICD-10-CM | POA: Diagnosis not present

## 2015-12-11 DIAGNOSIS — I10 Essential (primary) hypertension: Secondary | ICD-10-CM | POA: Diagnosis not present

## 2015-12-11 DIAGNOSIS — E1165 Type 2 diabetes mellitus with hyperglycemia: Secondary | ICD-10-CM | POA: Diagnosis not present

## 2015-12-12 DIAGNOSIS — Z23 Encounter for immunization: Secondary | ICD-10-CM | POA: Diagnosis not present

## 2016-01-06 ENCOUNTER — Other Ambulatory Visit: Payer: Self-pay | Admitting: Cardiology

## 2016-01-06 NOTE — Telephone Encounter (Signed)
Rx(s) sent to pharmacy electronically.  

## 2016-02-12 ENCOUNTER — Ambulatory Visit (INDEPENDENT_AMBULATORY_CARE_PROVIDER_SITE_OTHER): Payer: Self-pay | Admitting: Physician Assistant

## 2016-02-12 VITALS — BP 128/80 | HR 89 | Temp 98.0°F | Resp 16 | Ht 68.0 in | Wt 228.0 lb

## 2016-02-12 DIAGNOSIS — Z024 Encounter for examination for driving license: Secondary | ICD-10-CM
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
## 2016-03-05 DIAGNOSIS — N182 Chronic kidney disease, stage 2 (mild): Secondary | ICD-10-CM | POA: Diagnosis not present

## 2016-03-05 DIAGNOSIS — N08 Glomerular disorders in diseases classified elsewhere: Secondary | ICD-10-CM | POA: Diagnosis not present

## 2016-03-05 DIAGNOSIS — E1122 Type 2 diabetes mellitus with diabetic chronic kidney disease: Secondary | ICD-10-CM | POA: Diagnosis not present

## 2016-03-05 DIAGNOSIS — I1310 Hypertensive heart and chronic kidney disease without heart failure, with stage 1 through stage 4 chronic kidney disease, or unspecified chronic kidney disease: Secondary | ICD-10-CM | POA: Diagnosis not present

## 2016-03-11 DIAGNOSIS — N5201 Erectile dysfunction due to arterial insufficiency: Secondary | ICD-10-CM | POA: Diagnosis not present

## 2016-03-11 DIAGNOSIS — R972 Elevated prostate specific antigen [PSA]: Secondary | ICD-10-CM | POA: Diagnosis not present

## 2016-03-11 DIAGNOSIS — Z125 Encounter for screening for malignant neoplasm of prostate: Secondary | ICD-10-CM | POA: Diagnosis not present

## 2016-03-11 DIAGNOSIS — E291 Testicular hypofunction: Secondary | ICD-10-CM | POA: Diagnosis not present
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
## 2016-03-25 ENCOUNTER — Encounter: Payer: Self-pay | Admitting: Cardiology

## 2016-03-25 ENCOUNTER — Ambulatory Visit (INDEPENDENT_AMBULATORY_CARE_PROVIDER_SITE_OTHER): Payer: Medicare HMO | Admitting: Cardiology

## 2016-03-25 VITALS — BP 168/100 | HR 82 | Ht 68.0 in | Wt 234.0 lb

## 2016-03-25 DIAGNOSIS — E1169 Type 2 diabetes mellitus with other specified complication: Secondary | ICD-10-CM

## 2016-03-25 DIAGNOSIS — E785 Hyperlipidemia, unspecified: Secondary | ICD-10-CM

## 2016-03-25 DIAGNOSIS — E669 Obesity, unspecified: Secondary | ICD-10-CM

## 2016-03-25 DIAGNOSIS — I10 Essential (primary) hypertension: Secondary | ICD-10-CM | POA: Diagnosis not present

## 2016-03-25 DIAGNOSIS — I25810 Atherosclerosis of coronary artery bypass graft(s) without angina pectoris: Secondary | ICD-10-CM | POA: Diagnosis not present

## 2016-03-25 NOTE — Patient Instructions (Signed)
Continue your current therapy  Monitor your blood pressure at home- ideally would like BP to be less than 130/80. If it is staying high let me know.  I will request a copy of your lab work from Dr. Baird Cancer  I will see you in 6 months

## 2016-06-12 DIAGNOSIS — Z0100 Encounter for examination of eyes and vision without abnormal findings: Secondary | ICD-10-CM | POA: Diagnosis not present

## 2016-07-16 DIAGNOSIS — I1310 Hypertensive heart and chronic kidney disease without heart failure, with stage 1 through stage 4 chronic kidney disease, or unspecified chronic kidney disease: Secondary | ICD-10-CM | POA: Diagnosis not present

## 2016-07-16 DIAGNOSIS — E1122 Type 2 diabetes mellitus with diabetic chronic kidney disease: Secondary | ICD-10-CM | POA: Diagnosis not present

## 2016-07-16 DIAGNOSIS — N08 Glomerular disorders in diseases classified elsewhere: Secondary | ICD-10-CM | POA: Diagnosis not present

## 2016-07-16 DIAGNOSIS — N182 Chronic kidney disease, stage 2 (mild): Secondary | ICD-10-CM | POA: Diagnosis not present

## 2016-09-10 ENCOUNTER — Emergency Department (HOSPITAL_COMMUNITY): Payer: Medicare HMO

## 2016-09-10 ENCOUNTER — Observation Stay (HOSPITAL_COMMUNITY)
Admission: EM | Admit: 2016-09-10 | Discharge: 2016-09-11 | Disposition: A | Discharge status: Home / Self Care | Payer: Medicare HMO | Attending: Internal Medicine | Admitting: Internal Medicine

## 2016-09-10 DIAGNOSIS — Z6835 Body mass index (BMI) 35.0-35.9, adult: Secondary | ICD-10-CM | POA: Insufficient documentation

## 2016-09-10 DIAGNOSIS — R112 Nausea with vomiting, unspecified: Secondary | ICD-10-CM | POA: Diagnosis not present

## 2016-09-10 DIAGNOSIS — Z79899 Other long term (current) drug therapy: Secondary | ICD-10-CM | POA: Insufficient documentation

## 2016-09-10 DIAGNOSIS — I25810 Atherosclerosis of coronary artery bypass graft(s) without angina pectoris: Secondary | ICD-10-CM | POA: Diagnosis present

## 2016-09-10 DIAGNOSIS — E669 Obesity, unspecified: Secondary | ICD-10-CM | POA: Diagnosis not present

## 2016-09-10 DIAGNOSIS — Z87891 Personal history of nicotine dependence: Secondary | ICD-10-CM | POA: Insufficient documentation

## 2016-09-10 DIAGNOSIS — Z7984 Long term (current) use of oral hypoglycemic drugs: Secondary | ICD-10-CM | POA: Insufficient documentation

## 2016-09-10 DIAGNOSIS — Z86718 Personal history of other venous thrombosis and embolism: Secondary | ICD-10-CM | POA: Diagnosis not present

## 2016-09-10 DIAGNOSIS — E1169 Type 2 diabetes mellitus with other specified complication: Secondary | ICD-10-CM | POA: Diagnosis present

## 2016-09-10 DIAGNOSIS — I5032 Chronic diastolic (congestive) heart failure: Secondary | ICD-10-CM | POA: Diagnosis present

## 2016-09-10 DIAGNOSIS — I110 Hypertensive heart disease with heart failure: Secondary | ICD-10-CM | POA: Insufficient documentation

## 2016-09-10 DIAGNOSIS — E860 Dehydration: Principal | ICD-10-CM | POA: Insufficient documentation

## 2016-09-10 DIAGNOSIS — R6889 Other general symptoms and signs: Secondary | ICD-10-CM | POA: Diagnosis not present

## 2016-09-10 DIAGNOSIS — Z86711 Personal history of pulmonary embolism: Secondary | ICD-10-CM | POA: Insufficient documentation

## 2016-09-10 DIAGNOSIS — E119 Type 2 diabetes mellitus without complications: Secondary | ICD-10-CM | POA: Diagnosis not present

## 2016-09-10 DIAGNOSIS — E785 Hyperlipidemia, unspecified: Secondary | ICD-10-CM | POA: Diagnosis not present

## 2016-09-10 DIAGNOSIS — N179 Acute kidney failure, unspecified: Secondary | ICD-10-CM | POA: Diagnosis present

## 2016-09-10 DIAGNOSIS — I252 Old myocardial infarction: Secondary | ICD-10-CM | POA: Diagnosis not present

## 2016-09-10 DIAGNOSIS — Z7982 Long term (current) use of aspirin: Secondary | ICD-10-CM | POA: Insufficient documentation

## 2016-09-10 DIAGNOSIS — R55 Syncope and collapse: Secondary | ICD-10-CM | POA: Diagnosis present

## 2016-09-10 LAB — GLUCOSE, CAPILLARY: GLUCOSE-CAPILLARY: 240 mg/dL — AB (ref 65–99)

## 2016-09-10 LAB — BASIC METABOLIC PANEL
ANION GAP: 14 (ref 5–15)
BUN: 28 mg/dL — AB (ref 6–20)
CHLORIDE: 104 mmol/L (ref 101–111)
CO2: 19 mmol/L — ABNORMAL LOW (ref 22–32)
Calcium: 9.8 mg/dL (ref 8.9–10.3)
Creatinine, Ser: 1.83 mg/dL — ABNORMAL HIGH (ref 0.61–1.24)
GFR, EST AFRICAN AMERICAN: 40 mL/min — AB (ref >60)
GFR, EST NON AFRICAN AMERICAN: 34 mL/min — AB (ref >60)
Glucose, Bld: 340 mg/dL — ABNORMAL HIGH (ref 65–99)
POTASSIUM: 3.8 mmol/L (ref 3.5–5.1)
SODIUM: 137 mmol/L (ref 135–145)

## 2016-09-10 LAB — CBC
HCT: 37.3 % — ABNORMAL LOW (ref 39.0–52.0)
Hemoglobin: 12.1 g/dL — ABNORMAL LOW (ref 13.0–17.0)
MCH: 26.6 pg (ref 26.0–34.0)
MCHC: 32.4 g/dL (ref 30.0–36.0)
MCV: 82 fL (ref 78.0–100.0)
Platelets: 193 10*3/uL (ref 150–400)
RBC: 4.55 MIL/uL (ref 4.22–5.81)
RDW: 13 % (ref 11.5–15.5)
WBC: 14.6 10*3/uL — AB (ref 4.0–10.5)

## 2016-09-10 LAB — URINALYSIS, ROUTINE W REFLEX MICROSCOPIC
BILIRUBIN URINE: NEGATIVE
HGB URINE DIPSTICK: NEGATIVE
Ketones, ur: 5 mg/dL — AB
LEUKOCYTES UA: NEGATIVE
NITRITE: NEGATIVE
Protein, ur: NEGATIVE mg/dL
SPECIFIC GRAVITY, URINE: 1.018 (ref 1.005–1.030)
pH: 5 (ref 5.0–8.0)

## 2016-09-10 LAB — I-STAT TROPONIN, ED: Troponin i, poc: 0.04 ng/mL (ref 0.00–0.08)

## 2016-09-10 LAB — CBG MONITORING, ED: Glucose-Capillary: 296 mg/dL — ABNORMAL HIGH (ref 65–99)

## 2016-09-10 MED ORDER — ATORVASTATIN CALCIUM 80 MG PO TABS
80.0000 mg | ORAL_TABLET | Freq: Every day | ORAL | Status: DC
  Administered 2016-09-11: 80 mg via ORAL
  Filled 2016-09-10: qty 1

## 2016-09-10 MED ORDER — HYDRALAZINE HCL 20 MG/ML IJ SOLN
5.0000 mg | Freq: Once | INTRAMUSCULAR | Status: AC
  Administered 2016-09-10: 5 mg via INTRAVENOUS
  Filled 2016-09-10: qty 1

## 2016-09-10 MED ORDER — ASPIRIN EC 81 MG PO TBEC
81.0000 mg | DELAYED_RELEASE_TABLET | Freq: Every day | ORAL | Status: DC
  Administered 2016-09-11: 81 mg via ORAL
  Filled 2016-09-10: qty 1

## 2016-09-10 MED ORDER — ACETAMINOPHEN 325 MG PO TABS: 650.0000 mg | ORAL_TABLET | Freq: Four times a day (QID) | ORAL | Status: DC | PRN

## 2016-09-10 MED ORDER — SODIUM CHLORIDE 0.9 % IV SOLN
INTRAVENOUS | Status: DC
  Administered 2016-09-10 – 2016-09-11 (×2): via INTRAVENOUS

## 2016-09-10 MED ORDER — ONDANSETRON HCL 4 MG PO TABS: 4.0000 mg | ORAL_TABLET | Freq: Four times a day (QID) | ORAL | Status: DC | PRN

## 2016-09-10 MED ORDER — ACETAMINOPHEN 650 MG RE SUPP: 650.0000 mg | Freq: Four times a day (QID) | RECTAL | Status: DC | PRN

## 2016-09-10 MED ORDER — SODIUM CHLORIDE 0.9% FLUSH
3.0000 mL | Freq: Two times a day (BID) | INTRAVENOUS | Status: DC
  Administered 2016-09-10: 3 mL via INTRAVENOUS

## 2016-09-10 MED ORDER — ONDANSETRON HCL 4 MG/2ML IJ SOLN: 4.0000 mg | Freq: Four times a day (QID) | INTRAMUSCULAR | Status: DC | PRN

## 2016-09-10 MED ORDER — SODIUM CHLORIDE 0.9 % IV BOLUS (SEPSIS)
1000.0000 mL | Freq: Once | INTRAVENOUS | Status: AC
  Administered 2016-09-10: 1000 mL via INTRAVENOUS

## 2016-09-10 MED ORDER — POLYETHYLENE GLYCOL 3350 17 G PO PACK: 17.0000 g | PACK | Freq: Every day | ORAL | Status: DC | PRN

## 2016-09-10 MED ORDER — INSULIN ASPART 100 UNIT/ML ~~LOC~~ SOLN
0.0000 [IU] | Freq: Three times a day (TID) | SUBCUTANEOUS | Status: DC
  Administered 2016-09-11: 8 [IU] via SUBCUTANEOUS
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
## 2016-09-10 NOTE — ED Notes (Signed)
Pt ambulated to restroom without difficulty
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
## 2016-09-10 NOTE — ED Notes (Signed)
Pt transported to imaging.

## 2016-09-10 NOTE — ED Notes (Signed)
ED Provider at bedside. 

## 2016-09-10 NOTE — ED Triage Notes (Signed)
Patient comes in per [facility] with syncope. Fm home, mowing grass. Confused upon waking. +LOC. +emesis. Nausea noted. +incontinence. Hx triple bypass, DM. Diaphoretic. No pain. EKG SR with PVCs. Ems 144/86, 94 HR, 96% RA, cbg 187.

## 2016-09-10 NOTE — ED Provider Notes (Signed)
Patient found unresponsive by his wife after mowing the lawn this afternoon. It is unknown how long he was unresponsive for.Marland Kitchen He was alert and awake and presents with EMS. No treatment prior to coming here. He has no recall of the event. Presently alert Glasgow Coma Score 15 no distress lungs clear auscultation heart regular rate and rhythm abdomen obese nontender extremities no edema skin warm dry   Orlie Dakin, MD 09/10/16 1846

## 2016-09-10 NOTE — ED Notes (Signed)
Pt with 1 episode of emesis

## 2016-09-11 ENCOUNTER — Other Ambulatory Visit (HOSPITAL_COMMUNITY): Payer: Medicare HMO

## 2016-09-11 ENCOUNTER — Encounter (HOSPITAL_COMMUNITY): Payer: Self-pay

## 2016-09-11 DIAGNOSIS — T671XXD Heat syncope, subsequent encounter: Secondary | ICD-10-CM | POA: Diagnosis not present

## 2016-09-11 LAB — BASIC METABOLIC PANEL
Anion gap: 7 (ref 5–15)
BUN: 21 mg/dL — AB (ref 6–20)
CALCIUM: 9 mg/dL (ref 8.9–10.3)
CO2: 26 mmol/L (ref 22–32)
CREATININE: 1.19 mg/dL (ref 0.61–1.24)
Chloride: 106 mmol/L (ref 101–111)
GFR calc Af Amer: >60 mL/min (ref >60)
GFR, EST NON AFRICAN AMERICAN: 58 mL/min — AB (ref >60)
Glucose, Bld: 215 mg/dL — ABNORMAL HIGH (ref 65–99)
Potassium: 4.3 mmol/L (ref 3.5–5.1)
SODIUM: 139 mmol/L (ref 135–145)

## 2016-09-11 LAB — CBC
HCT: 34 % — ABNORMAL LOW (ref 39.0–52.0)
Hemoglobin: 10.9 g/dL — ABNORMAL LOW (ref 13.0–17.0)
MCH: 26.2 pg (ref 26.0–34.0)
MCHC: 32.1 g/dL (ref 30.0–36.0)
MCV: 81.7 fL (ref 78.0–100.0)
PLATELETS: 180 10*3/uL (ref 150–400)
RBC: 4.16 MIL/uL — ABNORMAL LOW (ref 4.22–5.81)
RDW: 13.2 % (ref 11.5–15.5)
WBC: 8.5 10*3/uL (ref 4.0–10.5)

## 2016-09-11 LAB — GLUCOSE, CAPILLARY
GLUCOSE-CAPILLARY: 204 mg/dL — AB (ref 65–99)
GLUCOSE-CAPILLARY: 263 mg/dL — AB (ref 65–99)
GLUCOSE-CAPILLARY: 135 mg/dL — AB (ref 65–99)

## 2016-09-11 MED ORDER — GLIMEPIRIDE 4 MG PO TABS
4.0000 mg | ORAL_TABLET | Freq: Two times a day (BID) | ORAL | Status: DC
  Administered 2016-09-11: 4 mg via ORAL
  Filled 2016-09-11: qty 1

## 2016-09-11 MED ORDER — VITAMIN D (ERGOCALCIFEROL) 1.25 MG (50000 UNIT) PO CAPS: 50000.0000 [IU] | ORAL_CAPSULE | ORAL | Status: DC

## 2016-09-11 MED ORDER — ASPIRIN 81 MG PO TBEC: 81.0000 mg | DELAYED_RELEASE_TABLET | Freq: Every day | ORAL | 0 refills | Status: AC

## 2016-09-11 MED ORDER — SODIUM CHLORIDE 0.9 % IV SOLN: Freq: Once | INTRAVENOUS | Status: DC

## 2016-09-11 NOTE — Care Management Note (Signed)
Case Management Note  Patient Details  Name: TEJON GRACIE MRN: [id number] Date of Birth: 1940-12-05  Subjective/Objective:                    Action/Plan: Pt discharging home with self care. Pt lives alone but daughter available to assist over the weekend and intermittently. Pt has insurance, PCP and transportation home. No further needs per CM.   Expected Discharge Date:  09/11/16               Expected Discharge Plan:  Home/Self Care  In-House Referral:     Discharge planning Services     Post Acute Care Choice:    Choice offered to:     DME Arranged:    DME Agency:     HH Arranged:    HH Agency:     Status of Service:  Completed, signed off  If discussed at [employer] of Stay Meetings, dates discussed:    Additional Comments:  Pollie Friar, RN 09/11/2016, 1:16 PM

## 2016-09-11 NOTE — Discharge Instructions (Signed)
Please follow up with pcp.do bmp in 2 days.restart metformin after creatinine normalized.

## 2016-09-11 NOTE — Care Management Obs Status (Signed)
[place] NOTIFICATION   Patient Details  Name: Patrick Massey MRN: [id number] Date of Birth: 02/08/1940   Medicare Observation Status Notification Given:  Yes    Pollie Friar, RN 09/11/2016, 11:34 AM

## 2016-09-11 NOTE — Progress Notes (Signed)
Pt is c/a/ox3, vs have been wnl. Pt is accompanied by his daughter, escorted off floor in wheelchair by staff. Pt denies complaints. AVS reviewed.

## 2016-09-11 NOTE — Progress Notes (Signed)
Pt requests to shower, advised interrupting IV Fluids will delay his anticipated discharge today.

## 2016-09-11 NOTE — Progress Notes (Signed)
Page to Dr Rodena Piety, to notify of IV fluids complete, and pt prepped for d/c per POC at rounding this morning.

## 2016-09-11 NOTE — Discharge Summary (Signed)
Physician Discharge Summary  Patrick Massey EXB:[id number] DOB: 29-May-1940 DOA: 09/10/2016  PCP: Glendale Chard, MD  Admit date: 09/10/2016 Discharge date: 09/11/2016  Admitted From:home Disposition:  home  Recommendations for Outpatient Follow-up:  1. Follow up with PCP in 1-2 weeks 2. Please obtain BMP/CBC in one week 3. Please follow up on the following pending results:  Home Health:none Equipment/Devices:none  Discharge Condition:stable CODE STATUS:full Diet recommendation: heart healthy Dictation #1 [id number]  [id number]   Brief/Interim Summary: Patient admitted with syncope secondary to dehydration.cardiac enzymes normal.  Discharge Diagnoses:  Principal Problem:   Syncope Active Problems:   Diabetes mellitus type 2 in obese (HCC)   CAD, ARTERY BYPASS GRAFT   DIASTOLIC HEART FAILURE, CHRONIC   AKI (acute kidney injury) [facility])    Discharge Instructions  Discharge Instructions    Call MD for:  difficulty breathing, headache or visual disturbances    Complete by:  As directed    Call MD for:  persistant dizziness or light-headedness    Complete by:  As directed    Call MD for:  severe uncontrolled pain    Complete by:  As directed    Call MD for:  temperature >100.4    Complete by:  As directed    Diet - low sodium heart healthy    Complete by:  As directed    Increase activity slowly    Complete by:  As directed      Allergies as of 09/11/2016      Reactions   Heparin Other (See Comments)   REACTION: platelets decreased      Medication List    STOP taking these medications   furosemide 80 MG tablet Commonly known as:  LASIX   KLOR-CON M20 20 MEQ tablet Generic drug:  potassium chloride SA   losartan 100 MG tablet Commonly known as:  COZAAR   polyethylene glycol packet Commonly known as:  MIRALAX / GLYCOLAX     TAKE these medications   aspirin EC 81 MG tablet Take 1 tablet (81 mg total) by mouth daily. What changed:  Another  medication with the same name was added. Make sure you understand how and when to take each.   aspirin 81 MG EC tablet Take 1 tablet (81 mg total) by mouth daily. What changed:  You were already taking a medication with the same name, and this prescription was added. Make sure you understand how and when to take each.   atorvastatin 80 MG tablet Commonly known as:  LIPITOR Take 1 tablet (80 mg total) by mouth daily.   glimepiride 4 MG tablet Commonly known as:  AMARYL Take 4 mg by mouth Twice daily.   sildenafil 50 MG tablet Commonly known as:  VIAGRA Take 50 mg by mouth daily as needed for erectile dysfunction.   Vitamin D (Ergocalciferol) 50000 units Caps capsule Commonly known as:  DRISDOL Take 50,000 Units by mouth 2 (two) times a week.      Follow-up Information    Glendale Chard, MD Follow up.   Specialty:  Internal Medicine Contact information: [address] [place] [address] [phone]          Allergies  Allergen Reactions  . Heparin Other (See Comments)    REACTION: platelets decreased    Consultation none   Procedures/Studies: Dg Chest 2 View  Result Date: 09/10/2016 CLINICAL DATA:  Syncope EXAM: CHEST  2 VIEW COMPARISON:  07/28/2013 chest radiograph. FINDINGS: Intact sternotomy wires. Stable cardiomediastinal silhouette with normal  heart size. No pneumothorax. No pleural effusion. Lungs appear clear, with no acute consolidative airspace disease and no pulmonary edema. IMPRESSION: No active cardiopulmonary disease. Electronically Signed   By: Ilona Sorrel M.D.   On: 09/10/2016 17:29   Ct Head Wo Contrast  Result Date: 09/10/2016 CLINICAL DATA:  Syncope EXAM: CT HEAD WITHOUT CONTRAST TECHNIQUE: Contiguous axial images were obtained from the base of the skull through the vertex without intravenous contrast. COMPARISON:  None. FINDINGS: Brain: No mass lesion, intraparenchymal hemorrhage or extra-axial collection. No evidence of acute  cortical infarct. 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 There is periventricular hypoattenuation compatible with chronic microvascular disease. 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 Vascular: No hyperdense vessel or unexpected calcification. Skull: Normal visualized skull base, calvarium and extracranial soft tissues. Sinuses/Orbits: No sinus fluid levels or advanced mucosal thickening. No mastoid effusion. Normal orbits. IMPRESSION: 1. No acute intracranial abnormality. 2. 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 Electronically Signed   By: Ulyses Jarred M.D.   On: 09/10/2016 17:21   (Echo, Carotid, EGD, Colonoscopy, ERCP)    Subjective:   Discharge Exam: Vitals:   09/11/16 0410 09/11/16 0801  BP: (!) 145/83 (!) 171/90  Pulse: 77 73  Resp: 18 18  Temp: 98 F (36.7 C) 98 F (36.7 C)  SpO2: 97% 98%   Vitals:   09/10/16 2123 09/10/16 2354 09/11/16 0410 09/11/16 0801  BP: (!) 185/103 (!) 161/85 (!) 145/83 (!) 171/90  Pulse: 88 82 77 73  Resp: 18 18 18 18   Temp: 98.1 F (36.7 C) 97.9 F (36.6 C) 98 F (36.7 C) 98 F (36.7 C)  TempSrc: Oral Oral Oral Oral  SpO2: 98% 98% 97% 98%  Weight: 103.5 kg (228 lb 3.2 oz)  104.2 kg (229 lb 12.8 oz)   Height: 5\' 6"  (1.676 m)       General: Pt is alert, awake, not in acute distress Cardiovascular: RRR, S1/S2 +, no rubs, no gallops Respiratory: CTA bilaterally, no wheezing, no rhonchi Abdominal: Soft, NT, ND, bowel sounds + Extremities: no edema, no cyanosis    The results of significant diagnostics from this hospitalization (including imaging, microbiology, ancillary and laboratory) are listed below for reference.     Microbiology: No results found for this or any previous visit (from the past 240 hour(s)).   Labs: BNP (last 3 results) No results for input(s): BNP in the last 8760 hours. Basic Metabolic Panel:  Recent Labs Lab 09/10/16 1621 09/11/16 0326  NA 137 139  K  3.8 4.3  CL 104 106  CO2 19* 26  GLUCOSE 340* 215*  BUN 28* 21*  CREATININE 1.83* 1.19  CALCIUM 9.8 9.0   Liver Function Tests: No results for input(s): AST, ALT, ALKPHOS, BILITOT, PROT, ALBUMIN in the last 168 hours. No results for input(s): LIPASE, AMYLASE in the last 168 hours. No results for input(s): AMMONIA in the last 168 hours. CBC:  Recent Labs Lab 09/10/16 1621 09/11/16 0326  WBC 14.6* 8.5  HGB 12.1* 10.9*  HCT 37.3* 34.0*  MCV 82.0 81.7  PLT 193 180   Cardiac Enzymes: No results for input(s): CKTOTAL, CKMB, CKMBINDEX, TROPONINI in the last 168 hours. BNP: Invalid input(s): POCBNP CBG:  Recent Labs Lab 09/10/16 1651 09/10/16 2131 09/11/16 0456 09/11/16 0859  GLUCAP 296* 240* 204* 263*   D-Dimer No results for input(s): DDIMER in the last 72 hours. Hgb A1c No results for input(s): HGBA1C in the last 72 hours. Lipid Profile No  results for input(s): CHOL, HDL, LDLCALC, TRIG, CHOLHDL, LDLDIRECT in the last 72 hours. Thyroid function studies No results for input(s): TSH, T4TOTAL, T3FREE, THYROIDAB in the last 72 hours.  Invalid input(s): FREET3 Anemia work up No results for input(s): VITAMINB12, FOLATE, FERRITIN, TIBC, IRON, RETICCTPCT in the last 72 hours. Urinalysis    Component Value Date/Time   COLORURINE YELLOW 09/10/2016 1733   APPEARANCEUR HAZY (A) 09/10/2016 1733   LABSPEC 1.018 09/10/2016 1733   PHURINE 5.0 09/10/2016 1733   GLUCOSEU >=500 (A) 09/10/2016 1733   HGBUR NEGATIVE 09/10/2016 1733   BILIRUBINUR NEGATIVE 09/10/2016 1733   KETONESUR 5 (A) 09/10/2016 1733   PROTEINUR NEGATIVE 09/10/2016 1733   UROBILINOGEN 0.2 07/23/2009 2019   NITRITE NEGATIVE 09/10/2016 1733   LEUKOCYTESUR NEGATIVE 09/10/2016 1733   Sepsis Labs Invalid input(s): PROCALCITONIN,  WBC,  LACTICIDVEN Microbiology No results found for this or any previous visit (from the past 240 hour(s)).   Time coordinating discharge: Over 30 minutes  SIGNED:   Georgette Shell, MD  [facility] 09/11/2016, 10:42 AM Pager   If 7PM-7AM, please contact night-coverage [email or web address] Password [email or web address]

## 2016-09-11 NOTE — Progress Notes (Signed)
Call placed to C[facility] to notify of telemetry monitoring d/c.   

## 2016-09-17 DIAGNOSIS — R531 Weakness: Secondary | ICD-10-CM | POA: Diagnosis not present

## 2016-09-17 DIAGNOSIS — R404 Transient alteration of awareness: Secondary | ICD-10-CM | POA: Diagnosis not present

## 2016-09-18 ENCOUNTER — Telehealth: Payer: Self-pay | Admitting: Cardiology

## 2016-09-18 NOTE — Telephone Encounter (Signed)
Pt c/o Syncope: STAT if syncope occurred within 30 minutes and pt complains of lightheadedness High Priority if episode of passing out, completely, today or in last 24 hours   1. Did you pass out today? no  2. When is the last time you passed out? Wednesday   3. Has this occurred multiple times? Happened twice within this month   4. Did you have any symptoms prior to passing out? No symptoms reported  Patient scheduled with Dr.Jordan 09-21-16 @1 :40

## 2016-09-18 NOTE — Telephone Encounter (Signed)
Unable to reach pt, lmvm
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
## 2016-09-21 ENCOUNTER — Encounter: Payer: Self-pay | Admitting: Cardiology

## 2016-09-21 ENCOUNTER — Ambulatory Visit (INDEPENDENT_AMBULATORY_CARE_PROVIDER_SITE_OTHER): Payer: Medicare HMO | Admitting: Cardiology

## 2016-09-21 VITALS — BP 180/102 | HR 76 | Ht 68.0 in | Wt 236.8 lb

## 2016-09-21 DIAGNOSIS — E669 Obesity, unspecified: Secondary | ICD-10-CM | POA: Diagnosis not present

## 2016-09-21 DIAGNOSIS — I25810 Atherosclerosis of coronary artery bypass graft(s) without angina pectoris: Secondary | ICD-10-CM

## 2016-09-21 DIAGNOSIS — E1169 Type 2 diabetes mellitus with other specified complication: Secondary | ICD-10-CM

## 2016-09-21 DIAGNOSIS — E785 Hyperlipidemia, unspecified: Secondary | ICD-10-CM | POA: Diagnosis not present

## 2016-09-21 DIAGNOSIS — I10 Essential (primary) hypertension: Secondary | ICD-10-CM | POA: Diagnosis not present

## 2016-09-21 DIAGNOSIS — R55 Syncope and collapse: Secondary | ICD-10-CM | POA: Diagnosis not present

## 2016-09-21 MED ORDER — DILTIAZEM HCL ER COATED BEADS 240 MG PO CP24: 240.0000 mg | ORAL_CAPSULE | Freq: Every day | ORAL | 3 refills | Status: DC

## 2016-09-21 NOTE — Patient Instructions (Signed)
We will arrange for you to have an Echocardiogram and EEG  We will have you wear an event monitor  We will add Diltiazem CD 240 mg daily for blood pressure  Continue valsartan 160 mg daily  I will see you in 2 months.

## 2016-09-21 NOTE — Telephone Encounter (Signed)
Patient was seen by dr Martinique today

## 2016-09-22 LAB — COMPREHENSIVE METABOLIC PANEL
A/G RATIO: 1.4 (ref 1.2–2.2)
ALK PHOS: 75 IU/L (ref 39–117)
ALT: 12 IU/L (ref 0–44)
AST: 15 IU/L (ref 0–40)
Albumin: 3.9 g/dL (ref 3.5–4.8)
BILIRUBIN TOTAL: 0.3 mg/dL (ref 0.0–1.2)
BUN/Creatinine Ratio: 11 (ref 10–24)
BUN: 14 mg/dL (ref 8–27)
CHLORIDE: 99 mmol/L (ref 96–106)
CO2: 24 mmol/L (ref 20–29)
Calcium: 9.3 mg/dL (ref 8.6–10.2)
Creatinine, Ser: 1.27 mg/dL (ref 0.76–1.27)
GFR calc Af Amer: 63 mL/min/{1.73_m2} (ref >59)
GFR, EST NON AFRICAN AMERICAN: 55 mL/min/{1.73_m2} — AB (ref >59)
GLOBULIN, TOTAL: 2.7 g/dL (ref 1.5–4.5)
Glucose: 202 mg/dL — ABNORMAL HIGH (ref 65–99)
POTASSIUM: 4.3 mmol/L (ref 3.5–5.2)
SODIUM: 137 mmol/L (ref 134–144)
Total Protein: 6.6 g/dL (ref 6.0–8.5)

## 2016-09-22 LAB — CBC WITH DIFFERENTIAL/PLATELET
Basophils Absolute: 0 10*3/uL (ref 0.0–0.2)
Basos: 0 %
EOS (ABSOLUTE): 0.2 10*3/uL (ref 0.0–0.4)
EOS: 2 %
HEMATOCRIT: 37.7 % (ref 37.5–51.0)
Hemoglobin: 12 g/dL — ABNORMAL LOW (ref 13.0–17.7)
IMMATURE GRANULOCYTES: 0 %
Immature Grans (Abs): 0 10*3/uL (ref 0.0–0.1)
Lymphocytes Absolute: 3.3 10*3/uL — ABNORMAL HIGH (ref 0.7–3.1)
Lymphs: 46 %
MCH: 26.3 pg — AB (ref 26.6–33.0)
MCHC: 31.8 g/dL (ref 31.5–35.7)
MCV: 83 fL (ref 79–97)
MONOS ABS: 0.5 10*3/uL (ref 0.1–0.9)
Monocytes: 6 %
NEUTROS PCT: 46 %
Neutrophils Absolute: 3.3 10*3/uL (ref 1.4–7.0)
PLATELETS: 187 10*3/uL (ref 150–379)
RBC: 4.56 x10E6/uL (ref 4.14–5.80)
RDW: 13.5 % (ref 12.3–15.4)
WBC: 7.2 10*3/uL (ref 3.4–10.8)

## 2016-09-23 DIAGNOSIS — R69 Illness, unspecified: Secondary | ICD-10-CM | POA: Diagnosis not present

## 2016-09-23 DIAGNOSIS — N182 Chronic kidney disease, stage 2 (mild): Secondary | ICD-10-CM | POA: Diagnosis not present

## 2016-09-23 DIAGNOSIS — N08 Glomerular disorders in diseases classified elsewhere: Secondary | ICD-10-CM | POA: Diagnosis not present

## 2016-09-23 DIAGNOSIS — E559 Vitamin D deficiency, unspecified: Secondary | ICD-10-CM | POA: Diagnosis not present

## 2016-09-23 DIAGNOSIS — E1122 Type 2 diabetes mellitus with diabetic chronic kidney disease: Secondary | ICD-10-CM | POA: Diagnosis not present

## 2016-09-23 DIAGNOSIS — R55 Syncope and collapse: Secondary | ICD-10-CM | POA: Diagnosis not present

## 2016-09-24 ENCOUNTER — Ambulatory Visit (HOSPITAL_COMMUNITY)
Admission: RE | Admit: 2016-09-24 | Discharge: 2016-09-24 | Disposition: A | Discharge status: Home / Self Care | Payer: Medicare HMO | Source: Ambulatory Visit | Attending: Cardiology | Admitting: Cardiology

## 2016-09-24 DIAGNOSIS — R55 Syncope and collapse: Secondary | ICD-10-CM | POA: Diagnosis not present

## 2016-09-24 NOTE — Procedures (Signed)
ELECTROENCEPHALOGRAM REPORT  Date of Study: 09/24/2016  Patient's Name: Patrick Massey MRN: [id number] Date of Birth: 05/09/1940  Referring Provider: Dr. Peter Martinique  Clinical History: This is a 76 year old man found unresponsive.  Medications: aspirin 81 MG EC tablet atorvastatin 80 MG tablet diltiazem 240 MG 24 hr capsule glimepiride 4 MG tablet glipiZIDE 2.5 MG 24 hr tablet metFORMIN 1000 MG tablet sildenafil 50 MG tablet valsartan 160 MG tablet Vitamin D (Ergocalciferol) 50000 units Caps capsule  Technical Summary: A multichannel digital EEG recording measured by the international 10-20 system with electrodes applied with paste and impedances below 5000 ohms performed in our laboratory with EKG monitoring in an awake and asleep patient.  Photic stimulation was performed.  The digital EEG was referentially recorded, reformatted, and digitally filtered in a variety of bipolar and referential montages for optimal display.    Description: The patient is awake and asleep during the recording.  During maximal wakefulness, there is a symmetric, medium voltage 10 Hz posterior dominant rhythm that attenuates with eye opening.  The record is symmetric.  During drowsiness and sleep, there is an increase in theta slowing of the background.  Vertex waves and symmetric sleep spindles were seen.  Photic stimulation did not elicit any abnormalities.  There were no epileptiform discharges or electrographic seizures seen.    EKG lead was unremarkable.  Impression: This awake and asleep EEG is normal.    Clinical Correlation: A normal EEG does not exclude a clinical diagnosis of epilepsy.  Clinical correlation is advised.   Ellouise Newer, M.D.

## 2016-09-24 NOTE — Progress Notes (Signed)
OP adult EEG completed, results pending. 

## 2016-10-01 ENCOUNTER — Other Ambulatory Visit: Payer: Self-pay

## 2016-10-01 ENCOUNTER — Ambulatory Visit (INDEPENDENT_AMBULATORY_CARE_PROVIDER_SITE_OTHER): Payer: Medicare HMO

## 2016-10-01 ENCOUNTER — Encounter: Payer: Self-pay | Admitting: Cardiology

## 2016-10-01 ENCOUNTER — Ambulatory Visit (HOSPITAL_COMMUNITY): Payer: Medicare HMO | Attending: Cardiovascular Disease

## 2016-10-01 DIAGNOSIS — I25810 Atherosclerosis of coronary artery bypass graft(s) without angina pectoris: Secondary | ICD-10-CM

## 2016-10-01 DIAGNOSIS — Z6836 Body mass index (BMI) 36.0-36.9, adult: Secondary | ICD-10-CM | POA: Insufficient documentation

## 2016-10-01 DIAGNOSIS — E785 Hyperlipidemia, unspecified: Secondary | ICD-10-CM

## 2016-10-01 DIAGNOSIS — E119 Type 2 diabetes mellitus without complications: Secondary | ICD-10-CM | POA: Diagnosis not present

## 2016-10-01 DIAGNOSIS — E669 Obesity, unspecified: Secondary | ICD-10-CM

## 2016-10-01 DIAGNOSIS — Z86718 Personal history of other venous thrombosis and embolism: Secondary | ICD-10-CM | POA: Diagnosis not present

## 2016-10-01 DIAGNOSIS — Z87891 Personal history of nicotine dependence: Secondary | ICD-10-CM | POA: Diagnosis not present

## 2016-10-01 DIAGNOSIS — R55 Syncope and collapse: Secondary | ICD-10-CM | POA: Diagnosis not present

## 2016-10-01 DIAGNOSIS — Z951 Presence of aortocoronary bypass graft: Secondary | ICD-10-CM | POA: Insufficient documentation

## 2016-10-01 DIAGNOSIS — Z8249 Family history of ischemic heart disease and other diseases of the circulatory system: Secondary | ICD-10-CM | POA: Insufficient documentation

## 2016-10-01 DIAGNOSIS — I252 Old myocardial infarction: Secondary | ICD-10-CM | POA: Diagnosis not present

## 2016-10-01 DIAGNOSIS — Z86711 Personal history of pulmonary embolism: Secondary | ICD-10-CM | POA: Diagnosis not present

## 2016-10-01 DIAGNOSIS — I10 Essential (primary) hypertension: Secondary | ICD-10-CM

## 2016-10-01 DIAGNOSIS — E1169 Type 2 diabetes mellitus with other specified complication: Secondary | ICD-10-CM

## 2016-10-09 ENCOUNTER — Other Ambulatory Visit: Payer: Self-pay

## 2016-10-09 MED ORDER — CARVEDILOL 12.5 MG PO TABS: 12.5000 mg | ORAL_TABLET | Freq: Two times a day (BID) | ORAL | 6 refills | Status: DC

## 2016-10-14 ENCOUNTER — Telehealth: Payer: Self-pay | Admitting: Cardiology

## 2016-10-14 ENCOUNTER — Ambulatory Visit (INDEPENDENT_AMBULATORY_CARE_PROVIDER_SITE_OTHER): Payer: Medicare HMO

## 2016-10-14 DIAGNOSIS — I480 Paroxysmal atrial fibrillation: Secondary | ICD-10-CM | POA: Diagnosis not present

## 2016-10-14 NOTE — Telephone Encounter (Signed)
Pt came in for EKG-ok per Dr Oval Linsey

## 2016-10-14 NOTE — Telephone Encounter (Signed)
New message      Calling to report an abnormal EKG.  Please call

## 2016-10-14 NOTE — Progress Notes (Signed)
Pt is here for Ekg, he had a abnormal monitor report this morning. Pt is not in AFIB per Dr Oval Linsey ok to D/C pt to go home. Pt states currently no sx of AFIB.

## 2016-10-14 NOTE — Telephone Encounter (Signed)
S/w pt he states that he was completely asymptomatic and was not exerting himself in any way. He was in the doctor's office and having drug testing done. Will forward to DOD for evaluation.

## 2016-10-14 NOTE — Telephone Encounter (Signed)
Per Dr Sheppard Evens pt come in for Ekg  S/w pt he states that he cannot come in right now-I stressed in the importance of coming in and he states maybe he can make it at 330pm. Tried to have pt come in sooner and he states that he cannot.

## 2016-11-04 DIAGNOSIS — R972 Elevated prostate specific antigen [PSA]: Secondary | ICD-10-CM | POA: Diagnosis not present

## 2016-11-04 DIAGNOSIS — R339 Retention of urine, unspecified: Secondary | ICD-10-CM | POA: Diagnosis not present

## 2016-11-04 DIAGNOSIS — N5201 Erectile dysfunction due to arterial insufficiency: Secondary | ICD-10-CM | POA: Diagnosis not present

## 2016-11-04 DIAGNOSIS — E291 Testicular hypofunction: Secondary | ICD-10-CM | POA: Diagnosis not present

## 2016-11-04 DIAGNOSIS — N481 Balanitis: Secondary | ICD-10-CM | POA: Diagnosis not present
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
## 2016-11-25 ENCOUNTER — Ambulatory Visit (INDEPENDENT_AMBULATORY_CARE_PROVIDER_SITE_OTHER): Payer: Medicare HMO | Admitting: Cardiology

## 2016-11-25 ENCOUNTER — Encounter: Payer: Self-pay | Admitting: Cardiology

## 2016-11-25 VITALS — BP 142/86 | HR 66 | Ht 68.0 in | Wt 226.0 lb

## 2016-11-25 DIAGNOSIS — I25810 Atherosclerosis of coronary artery bypass graft(s) without angina pectoris: Secondary | ICD-10-CM

## 2016-11-25 DIAGNOSIS — E785 Hyperlipidemia, unspecified: Secondary | ICD-10-CM | POA: Diagnosis not present

## 2016-11-25 DIAGNOSIS — I10 Essential (primary) hypertension: Secondary | ICD-10-CM

## 2016-11-25 DIAGNOSIS — R55 Syncope and collapse: Secondary | ICD-10-CM | POA: Diagnosis not present

## 2016-11-25 NOTE — Patient Instructions (Signed)
Continue your lifestyle changes with weight loss and walking  Continue your current therapy  You should have your cholesterol checked by Dr. Baird Cancer.  I will see you in 6 months.

## 2016-12-11 DIAGNOSIS — N182 Chronic kidney disease, stage 2 (mild): Secondary | ICD-10-CM | POA: Diagnosis not present

## 2016-12-11 DIAGNOSIS — E1122 Type 2 diabetes mellitus with diabetic chronic kidney disease: Secondary | ICD-10-CM | POA: Diagnosis not present

## 2016-12-11 DIAGNOSIS — N08 Glomerular disorders in diseases classified elsewhere: Secondary | ICD-10-CM | POA: Diagnosis not present

## 2017-01-01 DIAGNOSIS — N08 Glomerular disorders in diseases classified elsewhere: Secondary | ICD-10-CM | POA: Diagnosis not present

## 2017-01-01 DIAGNOSIS — I129 Hypertensive chronic kidney disease with stage 1 through stage 4 chronic kidney disease, or unspecified chronic kidney disease: Secondary | ICD-10-CM | POA: Diagnosis not present

## 2017-01-01 DIAGNOSIS — N182 Chronic kidney disease, stage 2 (mild): Secondary | ICD-10-CM | POA: Diagnosis not present

## 2017-01-01 DIAGNOSIS — E1122 Type 2 diabetes mellitus with diabetic chronic kidney disease: Secondary | ICD-10-CM | POA: Diagnosis not present

## 2017-01-20 DIAGNOSIS — R972 Elevated prostate specific antigen [PSA]: Secondary | ICD-10-CM | POA: Diagnosis not present

## 2017-01-20 DIAGNOSIS — N481 Balanitis: Secondary | ICD-10-CM | POA: Diagnosis not present

## 2017-01-20 DIAGNOSIS — R351 Nocturia: Secondary | ICD-10-CM | POA: Diagnosis not present

## 2017-02-04 ENCOUNTER — Encounter (INDEPENDENT_AMBULATORY_CARE_PROVIDER_SITE_OTHER): Payer: Self-pay | Admitting: Physician Assistant

## 2017-02-04 ENCOUNTER — Encounter: Payer: Self-pay | Admitting: Physician Assistant

## 2017-02-04 NOTE — Patient Instructions (Signed)
     IF you received an x-ray today, you will receive an invoice from [facility]. Please contact [facility] at [phone] with questions or concerns regarding your invoice.   IF you received labwork today, you will receive an invoice from [facility]. Please contact [facility] at [phone] with questions or concerns regarding your invoice.   Our billing staff will not be able to assist you with questions regarding bills from these companies.  You will be contacted with the lab results as soon as they are available. The fastest way to get your results is to activate your My Chart account. Instructions are located on the last page of this paperwork. If you have not heard from us regarding the results in 2 weeks, please contact this office.     

## 2017-02-05 ENCOUNTER — Telehealth: Payer: Self-pay | Admitting: Cardiology

## 2017-02-05 NOTE — Telephone Encounter (Signed)
Returned call to patient no answer.LMTC. 

## 2017-02-05 NOTE — Telephone Encounter (Signed)
Patient calling, would like to know if he can have a form completed; would not go into details about the type of document.

## 2017-02-08 MED ORDER — ATORVASTATIN CALCIUM 80 MG PO TABS: 80.0000 mg | ORAL_TABLET | Freq: Every day | ORAL | 3 refills | Status: DC

## 2017-02-08 NOTE — Telephone Encounter (Signed)
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

## 2017-02-08 NOTE — Telephone Encounter (Signed)
Received a call from patient.Dr.Jordan advised you have to wait 6 months.Advised 6 months will end 03/13/17.Advised to call me back after 03/13/17 and if no more syncope he will clear you to drive.

## 2017-02-16 NOTE — Progress Notes (Signed)
Patient was not seen today.  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

## 2017-02-17 DIAGNOSIS — C61 Malignant neoplasm of prostate: Secondary | ICD-10-CM | POA: Diagnosis not present

## 2017-02-17 DIAGNOSIS — R972 Elevated prostate specific antigen [PSA]: Secondary | ICD-10-CM | POA: Diagnosis not present

## 2017-02-17 DIAGNOSIS — N4232 Atypical small acinar proliferation of prostate: Secondary | ICD-10-CM | POA: Diagnosis not present

## 2017-03-04 DIAGNOSIS — N08 Glomerular disorders in diseases classified elsewhere: Secondary | ICD-10-CM | POA: Diagnosis not present

## 2017-03-04 DIAGNOSIS — Z6836 Body mass index (BMI) 36.0-36.9, adult: Secondary | ICD-10-CM | POA: Diagnosis not present

## 2017-03-04 DIAGNOSIS — N182 Chronic kidney disease, stage 2 (mild): Secondary | ICD-10-CM | POA: Diagnosis not present

## 2017-03-04 DIAGNOSIS — E1122 Type 2 diabetes mellitus with diabetic chronic kidney disease: Secondary | ICD-10-CM | POA: Diagnosis not present

## 2017-03-04 DIAGNOSIS — R5383 Other fatigue: Secondary | ICD-10-CM | POA: Diagnosis not present

## 2017-03-04 DIAGNOSIS — I129 Hypertensive chronic kidney disease with stage 1 through stage 4 chronic kidney disease, or unspecified chronic kidney disease: Secondary | ICD-10-CM | POA: Diagnosis not present

## 2017-03-04 DIAGNOSIS — Z79899 Other long term (current) drug therapy: Secondary | ICD-10-CM | POA: Diagnosis not present

## 2017-03-04 DIAGNOSIS — D649 Anemia, unspecified: Secondary | ICD-10-CM | POA: Diagnosis not present

## 2017-03-09 ENCOUNTER — Telehealth: Payer: Self-pay | Admitting: Cardiology

## 2017-03-09 NOTE — Telephone Encounter (Signed)
New message  Pt verbalized that he is calling for the RN  He did not disclose reason
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
## 2017-03-09 NOTE — Telephone Encounter (Signed)
He may resume driving.  Peter Martinique MD, [facility]

## 2017-03-10 ENCOUNTER — Telehealth: Payer: Self-pay | Admitting: Cardiology

## 2017-03-10 NOTE — Telephone Encounter (Signed)
Returned call to patient Dr.Jordan advised may resume driving.Advised I will call back when letter for pick up.

## 2017-03-10 NOTE — Telephone Encounter (Signed)
Returned call to patient.Dr.Jordan advised ok to drive.Advised I will type a letter Dr.Jordan will need to sign.Advised I will call back when ready for pick up.

## 2017-03-10 NOTE — Telephone Encounter (Signed)
Per pt call needs a letter concerning his DLT status for pick up per pt tomorrow? Please give pt a call when letter ready.

## 2017-03-12 NOTE — Telephone Encounter (Signed)
See previous 03/11/17 telephone note.

## 2017-03-12 NOTE — Telephone Encounter (Signed)
Returned call to patient.Advised Dr.Jordan has been out of office this week.Advised he can pick up letter stating he may resume driving Monday 8/92/11.

## 2017-03-18 DIAGNOSIS — C61 Malignant neoplasm of prostate: Secondary | ICD-10-CM | POA: Diagnosis not present

## 2017-03-22 ENCOUNTER — Other Ambulatory Visit: Payer: Self-pay | Admitting: Cardiology

## 2017-03-22 ENCOUNTER — Ambulatory Visit (INDEPENDENT_AMBULATORY_CARE_PROVIDER_SITE_OTHER): Payer: Self-pay | Admitting: Physician Assistant

## 2017-03-22 ENCOUNTER — Telehealth: Payer: Self-pay | Admitting: Cardiology

## 2017-03-22 DIAGNOSIS — I25810 Atherosclerosis of coronary artery bypass graft(s) without angina pectoris: Secondary | ICD-10-CM

## 2017-03-22 DIAGNOSIS — I5032 Chronic diastolic (congestive) heart failure: Secondary | ICD-10-CM

## 2017-03-22 DIAGNOSIS — I10 Essential (primary) hypertension: Secondary | ICD-10-CM

## 2017-03-22 DIAGNOSIS — Z5321 Procedure and treatment not carried out due to patient leaving prior to being seen by health care provider: Secondary | ICD-10-CM

## 2017-03-22 DIAGNOSIS — I480 Paroxysmal atrial fibrillation: Secondary | ICD-10-CM

## 2017-03-22 DIAGNOSIS — R400 Somnolence: Secondary | ICD-10-CM

## 2017-03-22 NOTE — Telephone Encounter (Signed)
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

## 2017-03-22 NOTE — Telephone Encounter (Signed)
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

## 2017-03-22 NOTE — Telephone Encounter (Signed)
Returned a call to patient to see if I could help him. He wanted to know the reason Dr Martinique requested that he have a sleep study. After reading Dr Doug Sou October note I informed the patient it is to evaluate the reason for his somnolence as well as other symptoms. Patient hesitantly agreed to have the sleep study. Protocol and procedure was explained to the patient. Sleep study scheduled for 03/31/17. pre certification done with [facility].

## 2017-03-22 NOTE — Telephone Encounter (Signed)
Lm to call back ./cy 

## 2017-03-22 NOTE — Progress Notes (Signed)
Patient with a history of presyncope.  He is being evaluated by cardiology however no diagnosis was made.  Cardiology recommending a sleep study.  Patient has refused a sleep study.  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  Patient chose not to be seen today for this reason.  Philis Fendt PA-C

## 2017-03-31 ENCOUNTER — Telehealth: Payer: Self-pay | Admitting: *Deleted

## 2017-03-31 ENCOUNTER — Other Ambulatory Visit: Payer: Self-pay | Admitting: Cardiology

## 2017-03-31 ENCOUNTER — Encounter (HOSPITAL_BASED_OUTPATIENT_CLINIC_OR_DEPARTMENT_OTHER): Payer: Medicare HMO

## 2017-03-31 DIAGNOSIS — I10 Essential (primary) hypertension: Secondary | ICD-10-CM

## 2017-03-31 DIAGNOSIS — I25810 Atherosclerosis of coronary artery bypass graft(s) without angina pectoris: Secondary | ICD-10-CM

## 2017-03-31 DIAGNOSIS — R400 Somnolence: Secondary | ICD-10-CM

## 2017-03-31 NOTE — Telephone Encounter (Signed)
Informed patient his insurance does not require pre authorization for a HST. He has been schedule for the HST on 04/09/17 @ 1:00 pm. [facility] contact information given to patient to call if questions. Patient voiced knowledge of their location.

## 2017-03-31 NOTE — Telephone Encounter (Signed)
Patient notified that the in lab sleep study scheduled for tonight has been cancelled. His insurance company denied in-lab study. Okay per Dr.Jordan to change to HST. PA will be submitted for HST. I will call him with HST appointment once his insurance company approves it and I have it scheduled. Patient voiced understanding that tonight's in lab study has been cancelled and not to go to the hospital.

## 2017-04-01 DIAGNOSIS — I1310 Hypertensive heart and chronic kidney disease without heart failure, with stage 1 through stage 4 chronic kidney disease, or unspecified chronic kidney disease: Secondary | ICD-10-CM | POA: Diagnosis not present

## 2017-04-01 DIAGNOSIS — R69 Illness, unspecified: Secondary | ICD-10-CM | POA: Diagnosis not present

## 2017-04-01 DIAGNOSIS — N182 Chronic kidney disease, stage 2 (mild): Secondary | ICD-10-CM | POA: Diagnosis not present

## 2017-04-01 DIAGNOSIS — R946 Abnormal results of thyroid function studies: Secondary | ICD-10-CM | POA: Diagnosis not present

## 2017-04-01 DIAGNOSIS — I2510 Atherosclerotic heart disease of native coronary artery without angina pectoris: Secondary | ICD-10-CM | POA: Diagnosis not present

## 2017-04-09 ENCOUNTER — Ambulatory Visit (HOSPITAL_BASED_OUTPATIENT_CLINIC_OR_DEPARTMENT_OTHER): Payer: Medicare HMO | Attending: Cardiology | Admitting: Cardiovascular Disease

## 2017-04-09 DIAGNOSIS — I10 Essential (primary) hypertension: Secondary | ICD-10-CM

## 2017-04-09 DIAGNOSIS — I25810 Atherosclerosis of coronary artery bypass graft(s) without angina pectoris: Secondary | ICD-10-CM

## 2017-04-09 DIAGNOSIS — G4733 Obstructive sleep apnea (adult) (pediatric): Secondary | ICD-10-CM

## 2017-04-09 DIAGNOSIS — R400 Somnolence: Secondary | ICD-10-CM

## 2017-04-16 DIAGNOSIS — C61 Malignant neoplasm of prostate: Secondary | ICD-10-CM | POA: Diagnosis not present

## 2017-04-20 ENCOUNTER — Other Ambulatory Visit: Payer: Self-pay | Admitting: Urology

## 2017-04-20 DIAGNOSIS — C61 Malignant neoplasm of prostate: Secondary | ICD-10-CM

## 2017-04-26 DIAGNOSIS — R69 Illness, unspecified: Secondary | ICD-10-CM | POA: Diagnosis not present

## 2017-05-05 ENCOUNTER — Telehealth: Payer: Self-pay | Admitting: *Deleted

## 2017-05-05 ENCOUNTER — Encounter: Payer: Self-pay | Admitting: Diagnostic Neuroimaging

## 2017-05-05 ENCOUNTER — Ambulatory Visit: Payer: Medicare HMO | Admitting: Diagnostic Neuroimaging

## 2017-05-05 ENCOUNTER — Encounter: Payer: Self-pay | Admitting: Physician Assistant

## 2017-05-05 NOTE — Telephone Encounter (Signed)
Patient was no show for new patient appointment today. 

## 2017-05-13 DIAGNOSIS — N08 Glomerular disorders in diseases classified elsewhere: Secondary | ICD-10-CM | POA: Diagnosis not present

## 2017-05-13 DIAGNOSIS — I1310 Hypertensive heart and chronic kidney disease without heart failure, with stage 1 through stage 4 chronic kidney disease, or unspecified chronic kidney disease: Secondary | ICD-10-CM | POA: Diagnosis not present

## 2017-05-13 DIAGNOSIS — Z1389 Encounter for screening for other disorder: Secondary | ICD-10-CM | POA: Diagnosis not present

## 2017-05-13 DIAGNOSIS — E039 Hypothyroidism, unspecified: Secondary | ICD-10-CM | POA: Diagnosis not present

## 2017-05-13 DIAGNOSIS — N182 Chronic kidney disease, stage 2 (mild): Secondary | ICD-10-CM | POA: Diagnosis not present

## 2017-05-13 DIAGNOSIS — E1122 Type 2 diabetes mellitus with diabetic chronic kidney disease: Secondary | ICD-10-CM | POA: Diagnosis not present

## 2017-05-14 ENCOUNTER — Ambulatory Visit
Admission: RE | Admit: 2017-05-14 | Discharge: 2017-05-14 | Disposition: A | Discharge status: Home / Self Care | Payer: Medicare HMO | Source: Ambulatory Visit | Attending: Urology | Admitting: Urology

## 2017-05-14 DIAGNOSIS — C61 Malignant neoplasm of prostate: Secondary | ICD-10-CM | POA: Diagnosis not present

## 2017-05-14 MED ORDER — GADOBENATE DIMEGLUMINE 529 MG/ML IV SOLN
20.0000 mL | Freq: Once | INTRAVENOUS | Status: AC | PRN
  Administered 2017-05-14: 20 mL via INTRAVENOUS

## 2017-05-17 DIAGNOSIS — R69 Illness, unspecified: Secondary | ICD-10-CM | POA: Diagnosis not present

## 2017-05-19 DIAGNOSIS — C61 Malignant neoplasm of prostate: Secondary | ICD-10-CM | POA: Diagnosis not present

## 2017-05-20 ENCOUNTER — Encounter: Payer: Self-pay | Admitting: Radiation Oncology

## 2017-06-02 DIAGNOSIS — E1122 Type 2 diabetes mellitus with diabetic chronic kidney disease: Secondary | ICD-10-CM | POA: Diagnosis not present

## 2017-06-02 DIAGNOSIS — N08 Glomerular disorders in diseases classified elsewhere: Secondary | ICD-10-CM | POA: Diagnosis not present

## 2017-06-02 DIAGNOSIS — N182 Chronic kidney disease, stage 2 (mild): Secondary | ICD-10-CM | POA: Diagnosis not present

## 2017-06-02 DIAGNOSIS — Z7189 Other specified counseling: Secondary | ICD-10-CM | POA: Diagnosis not present

## 2017-06-04 NOTE — Progress Notes (Unsigned)
Erroneous encounter

## 2017-06-08 ENCOUNTER — Encounter: Payer: Self-pay | Admitting: Radiation Oncology
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
## 2017-06-10 ENCOUNTER — Ambulatory Visit
Admission: RE | Admit: 2017-06-10 | Discharge: 2017-06-10 | Disposition: A | Discharge status: Home / Self Care | Payer: Medicare HMO | Source: Ambulatory Visit | Attending: Radiation Oncology | Admitting: Radiation Oncology

## 2017-06-10 ENCOUNTER — Encounter: Payer: Self-pay | Admitting: Radiation Oncology

## 2017-06-10 ENCOUNTER — Encounter: Payer: Self-pay | Admitting: Medical Oncology

## 2017-06-10 ENCOUNTER — Other Ambulatory Visit: Payer: Self-pay

## 2017-06-10 VITALS — BP 153/97 | HR 93 | Temp 97.6°F | Resp 18 | Wt 219.2 lb

## 2017-06-10 DIAGNOSIS — Z8042 Family history of malignant neoplasm of prostate: Secondary | ICD-10-CM | POA: Diagnosis not present

## 2017-06-10 DIAGNOSIS — I5032 Chronic diastolic (congestive) heart failure: Secondary | ICD-10-CM

## 2017-06-10 DIAGNOSIS — Z800 Family history of malignant neoplasm of digestive organs: Secondary | ICD-10-CM | POA: Diagnosis not present

## 2017-06-10 DIAGNOSIS — E1169 Type 2 diabetes mellitus with other specified complication: Secondary | ICD-10-CM

## 2017-06-10 DIAGNOSIS — C61 Malignant neoplasm of prostate: Secondary | ICD-10-CM

## 2017-06-10 DIAGNOSIS — E119 Type 2 diabetes mellitus without complications: Secondary | ICD-10-CM | POA: Diagnosis not present

## 2017-06-10 DIAGNOSIS — E7800 Pure hypercholesterolemia, unspecified: Secondary | ICD-10-CM | POA: Diagnosis not present

## 2017-06-10 DIAGNOSIS — I252 Old myocardial infarction: Secondary | ICD-10-CM | POA: Insufficient documentation

## 2017-06-10 DIAGNOSIS — Z803 Family history of malignant neoplasm of breast: Secondary | ICD-10-CM | POA: Insufficient documentation

## 2017-06-10 DIAGNOSIS — Z86718 Personal history of other venous thrombosis and embolism: Secondary | ICD-10-CM | POA: Diagnosis not present

## 2017-06-10 DIAGNOSIS — R972 Elevated prostate specific antigen [PSA]: Secondary | ICD-10-CM | POA: Diagnosis not present

## 2017-06-10 DIAGNOSIS — Z86711 Personal history of pulmonary embolism: Secondary | ICD-10-CM | POA: Insufficient documentation

## 2017-06-10 DIAGNOSIS — Z7982 Long term (current) use of aspirin: Secondary | ICD-10-CM | POA: Insufficient documentation

## 2017-06-10 DIAGNOSIS — Z7984 Long term (current) use of oral hypoglycemic drugs: Secondary | ICD-10-CM | POA: Insufficient documentation

## 2017-06-10 DIAGNOSIS — Z808 Family history of malignant neoplasm of other organs or systems: Secondary | ICD-10-CM | POA: Insufficient documentation

## 2017-06-10 DIAGNOSIS — Z79899 Other long term (current) drug therapy: Secondary | ICD-10-CM | POA: Insufficient documentation

## 2017-06-10 DIAGNOSIS — I2510 Atherosclerotic heart disease of native coronary artery without angina pectoris: Secondary | ICD-10-CM | POA: Diagnosis not present

## 2017-06-10 DIAGNOSIS — I10 Essential (primary) hypertension: Secondary | ICD-10-CM | POA: Diagnosis not present

## 2017-06-10 DIAGNOSIS — E669 Obesity, unspecified: Secondary | ICD-10-CM

## 2017-06-10 DIAGNOSIS — E785 Hyperlipidemia, unspecified: Secondary | ICD-10-CM | POA: Diagnosis not present

## 2017-06-10 DIAGNOSIS — Z87891 Personal history of nicotine dependence: Secondary | ICD-10-CM | POA: Insufficient documentation

## 2017-06-10 HISTORY — DX: Malignant neoplasm of prostate: C61
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
## 2017-06-10 NOTE — Progress Notes (Signed)
See progress note under physician encounter. 

## 2017-06-10 NOTE — Progress Notes (Signed)
Introduced myself to Patrick Massey and his family as the prostate nurse navigator and my role. His wife is a former Product manager at [employer]. He has been under active surveillance since 2010. In 02/2017 had a repeat biopsy  that only showed one positive core but in  March his PSA increased to 16.30. He has decided to move forward with treatment. He voices interest in brachytherapy.

## 2017-06-15 ENCOUNTER — Telehealth: Payer: Self-pay | Admitting: *Deleted

## 2017-06-15 NOTE — Telephone Encounter (Signed)
Called patient to inform of pre-seed planning CT, vm full, unable to leave message, mailed appt. card

## 2017-06-22 ENCOUNTER — Telehealth: Payer: Self-pay | Admitting: Cardiology

## 2017-06-22 NOTE — Telephone Encounter (Signed)
Dr Martinique, this pt is planned for prostate implant. Can his aspirin be held for the procedure and how long?  Please route response back to CV DIV PREOP

## 2017-06-22 NOTE — Telephone Encounter (Signed)
He may hold ASA for prostate surgery  Peter Martinique MD, [facility]

## 2017-06-22 NOTE — Telephone Encounter (Signed)
New Message       [facility] Pre-operative Risk Assessment    Request for surgical clearance:  1. What type of surgery is being performed? Prostate Implant   2. When is this surgery scheduled? TBD     3. What type of clearance is required (medical clearance vs. Pharmacy clearance to hold med vs. Both)? Medical and Pharmacy   4. Are there any medications that need to be held prior to surgery and how long? Aspirin 5-7 days  5. Practice name and name of physician performing surgery? Radiation Oncology/ Urologist is Public house manager Dr.  Festus Aloe  6. What is your office phone number [phone]    7.   What is your office fax number [phone]  8.   Anesthesia type (None, local, MAC, general) ? General    Patrick Massey 06/22/2017, 11:09 AM  _________________________________________________________________   (provider comments below)

## 2017-06-23 NOTE — Telephone Encounter (Signed)
   Primary Cardiologist:Peter Martinique, MD  Chart reviewed as part of pre-operative protocol coverage. Because of Patrick Massey's past medical history and time since last visit (11/25/16). Recommended to follow up in 6 months.  He will require a follow-up visit in order to better assess preoperative cardiovascular risk.  Pre-op covering staff: - Please schedule appointment and call patient to inform them. - Please contact requesting surgeon's office via preferred method (i.e, phone, fax) to inform them of need for appointment prior to surgery.  [place], [place]  06/23/2017, 3:56 PM

## 2017-06-24 ENCOUNTER — Telehealth: Payer: Self-pay | Admitting: *Deleted

## 2017-06-24 ENCOUNTER — Telehealth: Payer: Self-pay | Admitting: Cardiology

## 2017-06-24 NOTE — Telephone Encounter (Signed)
CALLED PATIENT TO INFORM THAT DR. Doug Sou OFFICE WILL BE CALLING HIM TO ARRANGE AN APPT. FOR A CARDIAC CLEARANCE, SPOKE WITH PATIENT AND HE IS AWARE THAT THEY WILL BE CALLING.

## 2017-06-24 NOTE — Telephone Encounter (Signed)
Called pt to set up appointment. Unable to leave message due to VM being full.   Spoke with Enid Derry from radiation Oncology and informed that Pt would need to be seen before surgically cleared. Verbalized Understanding.

## 2017-06-24 NOTE — Telephone Encounter (Signed)
New Message   Patient is calling because he was advised by oncology that [facility] was trying to reach him about his clearnce. Please call.

## 2017-06-25 ENCOUNTER — Telehealth: Payer: Self-pay | Admitting: *Deleted

## 2017-06-25 NOTE — Telephone Encounter (Signed)
Returned call to pt. No answer and unable to leave message. 

## 2017-06-25 NOTE — Telephone Encounter (Signed)
Spoke with pt and informed that he would need to been seen in office for further evaluation before he can be surgical cleared for the procedure. Pt verbalized understanding. Appointment scheduled for 07/05/17 at 11 am with Almyra Deforest, PA  A message was left for [place] at Radiation Oncology to inform of pt's appointment.

## 2017-06-25 NOTE — Telephone Encounter (Signed)
Spoke with pt and informed that he would need to been seen in office for further evaluation before he can be surgical cleared for the procedure. Pt verbalized understanding. Appointment scheduled for 07/05/17 at 11 am with Almyra Deforest, PA  A message was left for [place] at Radiation Oncology to inform of pt's appointment.

## 2017-06-25 NOTE — Telephone Encounter (Signed)
Called patient to inform that pre-seed appt. has been moved to 07-07-17 - arrival time - 8:45 am @ [facility], spoke with patient and he is aware of these appts.

## 2017-06-26 ENCOUNTER — Other Ambulatory Visit: Payer: Self-pay | Admitting: Cardiology

## 2017-06-29 NOTE — Telephone Encounter (Signed)
Rx(s) sent to pharmacy electronically.  

## 2017-06-30 ENCOUNTER — Ambulatory Visit: Payer: Medicare HMO | Admitting: Radiation Oncology

## 2017-07-05 ENCOUNTER — Ambulatory Visit: Payer: Medicare HMO | Admitting: Physician Assistant

## 2017-07-05 ENCOUNTER — Encounter: Payer: Self-pay | Admitting: Physician Assistant

## 2017-07-05 VITALS — BP 132/88 | HR 77 | Ht 68.0 in | Wt 226.0 lb

## 2017-07-05 DIAGNOSIS — R55 Syncope and collapse: Secondary | ICD-10-CM | POA: Diagnosis not present

## 2017-07-05 DIAGNOSIS — I10 Essential (primary) hypertension: Secondary | ICD-10-CM | POA: Diagnosis not present

## 2017-07-05 DIAGNOSIS — E785 Hyperlipidemia, unspecified: Secondary | ICD-10-CM | POA: Diagnosis not present

## 2017-07-05 DIAGNOSIS — I2699 Other pulmonary embolism without acute cor pulmonale: Secondary | ICD-10-CM

## 2017-07-05 DIAGNOSIS — I480 Paroxysmal atrial fibrillation: Secondary | ICD-10-CM

## 2017-07-05 DIAGNOSIS — I2510 Atherosclerotic heart disease of native coronary artery without angina pectoris: Secondary | ICD-10-CM | POA: Diagnosis not present

## 2017-07-05 DIAGNOSIS — E119 Type 2 diabetes mellitus without complications: Secondary | ICD-10-CM | POA: Diagnosis not present

## 2017-07-05 DIAGNOSIS — Z01810 Encounter for preprocedural cardiovascular examination: Secondary | ICD-10-CM

## 2017-07-05 MED ORDER — ATORVASTATIN CALCIUM 80 MG PO TABS: 80.0000 mg | ORAL_TABLET | Freq: Every day | ORAL | 3 refills | Status: DC
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
## 2017-07-05 NOTE — Patient Instructions (Addendum)
Medication Instructions:  Your physician recommends that you continue on your current medications as directed. Please refer to the Current Medication list given to you today.  Labwork: NONE  Testing/Procedures: NONE  Follow-Up: Your physician wants you to follow-up in: 6 MONTHS WITH DR Martinique You will receive a reminder letter in the mail two months in advance. If you don't receive a letter, please call our office to schedule the follow-up appointment.  Any Other Special Instructions Will Be Listed Below (If Applicable).  YOU ARE CLEARED FROM A CARDIAC STANDPOINT FOR YOUR UPCOMING SURGERY   If you need a refill on your cardiac medications before your next appointment, please call your pharmacy.

## 2017-07-06 ENCOUNTER — Telehealth: Payer: Self-pay | Admitting: *Deleted

## 2017-07-06 NOTE — Telephone Encounter (Signed)
Called patient to remind of pre-seed appts. For 07-07-17, spoke with patient and he is aware of these appts.

## 2017-07-07 ENCOUNTER — Ambulatory Visit
Admission: RE | Admit: 2017-07-07 | Discharge: 2017-07-07 | Disposition: A | Discharge status: Home / Self Care | Payer: Medicare HMO | Source: Ambulatory Visit | Attending: Radiation Oncology | Admitting: Radiation Oncology

## 2017-07-07 DIAGNOSIS — C61 Malignant neoplasm of prostate: Secondary | ICD-10-CM | POA: Diagnosis not present

## 2017-07-07 NOTE — Progress Notes (Signed)
  Radiation Oncology         [phone] ________________________________  Name: Patrick Massey MRN: [id number]  Date: 07/07/2017  DOB: 10/04/1940  SIMULATION AND TREATMENT PLANNING NOTE PUBIC ARCH STUDY  EX:[id number], Bailey Mech, MD  Festus Aloe, MD  DIAGNOSIS: 77 y.o. male with Stage T1c adenocarcinoma of the prostate with Gleason Score of 3+3, and PSA of 16.30     ICD-10-CM   1. Malignant neoplasm of prostate ([place]) C61     COMPLEX SIMULATION:  The patient presented today for evaluation for possible prostate seed implant. He was brought to the radiation planning suite and placed supine on the CT couch. A 3-dimensional image study set was obtained in upload to the planning computer. There, on each axial slice, I contoured the prostate gland. Then, using three-dimensional radiation planning tools I reconstructed the prostate in view of the structures from the transperineal needle pathway to assess for possible pubic arch interference. In doing so, I did not appreciate any pubic arch interference. Also, the patient's prostate volume was estimated based on the drawn structure. The volume was 47 cc.  Given the pubic arch appearance and prostate volume, patient remains a good candidate to proceed with prostate seed implant. Today, he freely provided informed written consent to proceed.    PLAN: The patient will undergo prostate seed implant to 145 Gy.   ________________________________  Sheral Apley. Tammi Klippel, M.D.

## 2017-07-17 IMAGING — NM NM MISC PROCEDURE
6 series · 36 of 36 positions shown · non-contrast
Comparison: none

[Series 1: wbr_r-proj_st wbr rest · 6.40mm/px · 6 of 64 frames shown]
[frame 6/64]
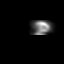
[frame 16/64]
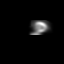
[frame 27/64]
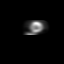
[frame 38/64]
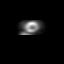
[frame 48/64]
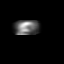
[frame 59/64]
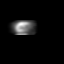

[Series 1: wbr rest · 6.40mm/px · 6 of 64 frames shown]
[frame 6/64]
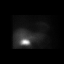
[frame 16/64]
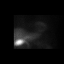
[frame 27/64]
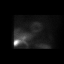
[frame 38/64]
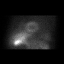
[frame 48/64]
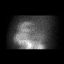
[frame 59/64]
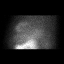

[Series 2: wbr stress-gsp · 6.40mm/px · 6 of 509 frames shown]
[frame 43/509]
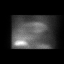
[frame 127/509]
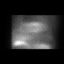
[frame 212/509]
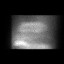
[frame 297/509]
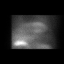
[frame 382/509]
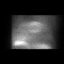
[frame 467/509]
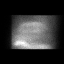

[Series 2: wbr_s-proj_st wbr stress-gsp · 6.40mm/px · 6 of 512 frames shown]
[frame 43/512]
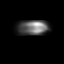
[frame 128/512]
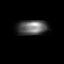
[frame 214/512]
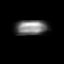
[frame 299/512]
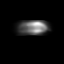
[frame 384/512]
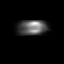
[frame 470/512]
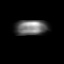

[Series 3: wbr stress-sum-em · 6.40mm/px · 6 of 64 frames shown]
[frame 6/64]
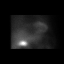
[frame 16/64]
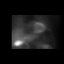
[frame 27/64]
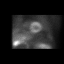
[frame 38/64]
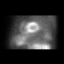
[frame 48/64]
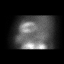
[frame 59/64]
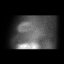

[Series 3: wbr_s-proj_st wbr stress-sum-em · 6.40mm/px · 6 of 64 frames shown]
[frame 6/64]
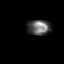
[frame 16/64]
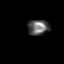
[frame 27/64]
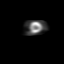
[frame 38/64]
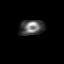
[frame 48/64]
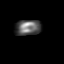
[frame 59/64]
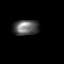

[36 of 36 positions shown; findings below may reference images not displayed]

Canned report from images found in remote index.

Refer to host system for actual result text.

## 2017-08-06 ENCOUNTER — Telehealth: Payer: Self-pay | Admitting: *Deleted

## 2017-08-06 ENCOUNTER — Other Ambulatory Visit: Payer: Self-pay | Admitting: Urology

## 2017-08-06 NOTE — Telephone Encounter (Signed)
Called patient to inform of implant date, spoke with patient and he is aware of this date. 

## 2017-08-12 DIAGNOSIS — N182 Chronic kidney disease, stage 2 (mild): Secondary | ICD-10-CM | POA: Diagnosis not present

## 2017-08-12 DIAGNOSIS — E1122 Type 2 diabetes mellitus with diabetic chronic kidney disease: Secondary | ICD-10-CM | POA: Diagnosis not present

## 2017-08-12 DIAGNOSIS — R55 Syncope and collapse: Secondary | ICD-10-CM | POA: Diagnosis not present

## 2017-08-12 DIAGNOSIS — I1310 Hypertensive heart and chronic kidney disease without heart failure, with stage 1 through stage 4 chronic kidney disease, or unspecified chronic kidney disease: Secondary | ICD-10-CM | POA: Diagnosis not present

## 2017-08-12 DIAGNOSIS — N08 Glomerular disorders in diseases classified elsewhere: Secondary | ICD-10-CM | POA: Diagnosis not present

## 2017-08-12 DIAGNOSIS — R946 Abnormal results of thyroid function studies: Secondary | ICD-10-CM | POA: Diagnosis not present

## 2017-09-20 ENCOUNTER — Telehealth: Payer: Self-pay

## 2017-09-20 MED ORDER — DILTIAZEM HCL ER COATED BEADS 240 MG PO CP24: 240.0000 mg | ORAL_CAPSULE | Freq: Every day | ORAL | 3 refills | Status: DC

## 2017-09-20 NOTE — Telephone Encounter (Signed)
Received a call from patient.He stated [facility] out of Diltiazem.Stated he wanted a prescription sent to [facility].After reviewing chart Diltiazem not listed.Stated he has been taking.Stated he does not remember dosage.Spoke to pharmacist at [facility] she stated pt has been receiving Diltiazem 240 mg daily.

## 2017-09-21 NOTE — Telephone Encounter (Signed)
Diltiazem 240 mg refill sent to [facility] [address]

## 2017-09-27 ENCOUNTER — Telehealth: Payer: Self-pay | Admitting: *Deleted

## 2017-09-27 NOTE — Telephone Encounter (Signed)
CALLED PATIENT TO INFORM OF LAB APPT. FOR 09-28-17 @ 11 AM, SPOKE WITH PATIENT AND HE IS AWARE OF THIS APPT.

## 2017-09-28 ENCOUNTER — Encounter (HOSPITAL_COMMUNITY)
Admission: RE | Admit: 2017-09-28 | Discharge: 2017-09-28 | Disposition: A | Discharge status: Home / Self Care | Payer: Medicare HMO | Source: Ambulatory Visit | Attending: Urology | Admitting: Urology

## 2017-09-28 ENCOUNTER — Encounter (INDEPENDENT_AMBULATORY_CARE_PROVIDER_SITE_OTHER): Payer: Self-pay

## 2017-09-28 DIAGNOSIS — I10 Essential (primary) hypertension: Secondary | ICD-10-CM | POA: Diagnosis not present

## 2017-09-28 DIAGNOSIS — I252 Old myocardial infarction: Secondary | ICD-10-CM | POA: Insufficient documentation

## 2017-09-28 DIAGNOSIS — E785 Hyperlipidemia, unspecified: Secondary | ICD-10-CM | POA: Diagnosis not present

## 2017-09-28 DIAGNOSIS — Z86718 Personal history of other venous thrombosis and embolism: Secondary | ICD-10-CM | POA: Diagnosis not present

## 2017-09-28 DIAGNOSIS — I2510 Atherosclerotic heart disease of native coronary artery without angina pectoris: Secondary | ICD-10-CM | POA: Diagnosis not present

## 2017-09-28 DIAGNOSIS — E119 Type 2 diabetes mellitus without complications: Secondary | ICD-10-CM | POA: Insufficient documentation

## 2017-09-28 DIAGNOSIS — Z01812 Encounter for preprocedural laboratory examination: Secondary | ICD-10-CM | POA: Insufficient documentation

## 2017-09-28 DIAGNOSIS — C61 Malignant neoplasm of prostate: Secondary | ICD-10-CM | POA: Diagnosis not present

## 2017-09-28 LAB — COMPREHENSIVE METABOLIC PANEL
ALBUMIN: 3.8 g/dL (ref 3.5–5.0)
ALK PHOS: 94 U/L (ref 38–126)
ALT: 21 U/L (ref 0–44)
AST: 22 U/L (ref 15–41)
Anion gap: 7 (ref 5–15)
BUN: 16 mg/dL (ref 8–23)
CALCIUM: 9.9 mg/dL (ref 8.9–10.3)
CHLORIDE: 105 mmol/L (ref 98–111)
CO2: 28 mmol/L (ref 22–32)
CREATININE: 1.09 mg/dL (ref 0.61–1.24)
GFR calc Af Amer: >60 mL/min (ref >60)
GFR calc non Af Amer: >60 mL/min (ref >60)
GLUCOSE: 249 mg/dL — AB (ref 70–99)
Potassium: 4.9 mmol/L (ref 3.5–5.1)
SODIUM: 140 mmol/L (ref 135–145)
Total Bilirubin: 0.9 mg/dL (ref 0.3–1.2)
Total Protein: 7.3 g/dL (ref 6.5–8.1)

## 2017-09-28 LAB — CBC
HCT: 41.2 % (ref 39.0–52.0)
HEMOGLOBIN: 13.1 g/dL (ref 13.0–17.0)
MCH: 26.9 pg (ref 26.0–34.0)
MCHC: 31.8 g/dL (ref 30.0–36.0)
MCV: 84.6 fL (ref 78.0–100.0)
Platelets: 216 10*3/uL (ref 150–400)
RBC: 4.87 MIL/uL (ref 4.22–5.81)
RDW: 13 % (ref 11.5–15.5)
WBC: 6.6 10*3/uL (ref 4.0–10.5)

## 2017-09-28 LAB — PROTIME-INR
INR: 0.95
Prothrombin Time: 12.5 seconds (ref 11.4–15.2)

## 2017-09-28 LAB — APTT: APTT: 29 s (ref 24–36)

## 2017-09-29 ENCOUNTER — Encounter (HOSPITAL_BASED_OUTPATIENT_CLINIC_OR_DEPARTMENT_OTHER): Payer: Self-pay | Admitting: *Deleted

## 2017-09-29 ENCOUNTER — Other Ambulatory Visit: Payer: Self-pay | Admitting: Urology

## 2017-09-29 ENCOUNTER — Other Ambulatory Visit: Payer: Self-pay

## 2017-09-29 DIAGNOSIS — C61 Malignant neoplasm of prostate: Secondary | ICD-10-CM

## 2017-09-29 NOTE — Progress Notes (Signed)
Spoke w/ pt via phone for pre-op interview.  Npo after mn w/ exception clear liquids until 0630 (no cream/milk products).  Arrive at 1030.  Needs cxr (last cxr in epic not current).  Will take coreg, cardizem, and lipitor am dos w/ sips of water and do fleet enema.  Current ekg and lab results dated 09-28-2017 in chart and epic.  Cardiac clearance with lov note w/ chart and epic.

## 2017-09-30 DIAGNOSIS — E039 Hypothyroidism, unspecified: Secondary | ICD-10-CM | POA: Diagnosis not present

## 2017-10-01 ENCOUNTER — Telehealth: Payer: Self-pay | Admitting: *Deleted

## 2017-10-01 NOTE — Telephone Encounter (Signed)
CALLED PATIENT TO REMIND OF PROCEDURE FOR 10-05-17, SPOKE WITH PATIENT AND HE IS AWARE OF THIS PROCEDURE

## 2017-10-05 ENCOUNTER — Ambulatory Visit (HOSPITAL_BASED_OUTPATIENT_CLINIC_OR_DEPARTMENT_OTHER): Payer: Medicare HMO | Admitting: Anesthesiology

## 2017-10-05 ENCOUNTER — Other Ambulatory Visit: Payer: Self-pay

## 2017-10-05 ENCOUNTER — Encounter (HOSPITAL_BASED_OUTPATIENT_CLINIC_OR_DEPARTMENT_OTHER)
Admission: RE | Disposition: A | Discharge status: Home / Self Care | Payer: Self-pay | Source: Ambulatory Visit | Attending: Urology

## 2017-10-05 ENCOUNTER — Ambulatory Visit (HOSPITAL_COMMUNITY): Payer: Medicare HMO

## 2017-10-05 ENCOUNTER — Encounter (HOSPITAL_BASED_OUTPATIENT_CLINIC_OR_DEPARTMENT_OTHER): Payer: Self-pay | Admitting: *Deleted

## 2017-10-05 ENCOUNTER — Ambulatory Visit (HOSPITAL_BASED_OUTPATIENT_CLINIC_OR_DEPARTMENT_OTHER)
Admission: RE | Admit: 2017-10-05 | Discharge: 2017-10-06 | Disposition: A | Discharge status: Home / Self Care | Payer: Medicare HMO | Source: Ambulatory Visit | Attending: Urology | Admitting: Urology

## 2017-10-05 DIAGNOSIS — E785 Hyperlipidemia, unspecified: Secondary | ICD-10-CM | POA: Insufficient documentation

## 2017-10-05 DIAGNOSIS — Z87891 Personal history of nicotine dependence: Secondary | ICD-10-CM | POA: Insufficient documentation

## 2017-10-05 DIAGNOSIS — Z79899 Other long term (current) drug therapy: Secondary | ICD-10-CM | POA: Diagnosis not present

## 2017-10-05 DIAGNOSIS — E1122 Type 2 diabetes mellitus with diabetic chronic kidney disease: Secondary | ICD-10-CM | POA: Diagnosis not present

## 2017-10-05 DIAGNOSIS — N182 Chronic kidney disease, stage 2 (mild): Secondary | ICD-10-CM | POA: Insufficient documentation

## 2017-10-05 DIAGNOSIS — Z86711 Personal history of pulmonary embolism: Secondary | ICD-10-CM | POA: Diagnosis not present

## 2017-10-05 DIAGNOSIS — Z7984 Long term (current) use of oral hypoglycemic drugs: Secondary | ICD-10-CM | POA: Diagnosis not present

## 2017-10-05 DIAGNOSIS — I480 Paroxysmal atrial fibrillation: Secondary | ICD-10-CM | POA: Diagnosis not present

## 2017-10-05 DIAGNOSIS — I130 Hypertensive heart and chronic kidney disease with heart failure and stage 1 through stage 4 chronic kidney disease, or unspecified chronic kidney disease: Secondary | ICD-10-CM | POA: Diagnosis not present

## 2017-10-05 DIAGNOSIS — I5030 Unspecified diastolic (congestive) heart failure: Secondary | ICD-10-CM | POA: Insufficient documentation

## 2017-10-05 DIAGNOSIS — Z86718 Personal history of other venous thrombosis and embolism: Secondary | ICD-10-CM | POA: Insufficient documentation

## 2017-10-05 DIAGNOSIS — C61 Malignant neoplasm of prostate: Secondary | ICD-10-CM | POA: Diagnosis not present

## 2017-10-05 DIAGNOSIS — I2510 Atherosclerotic heart disease of native coronary artery without angina pectoris: Secondary | ICD-10-CM | POA: Insufficient documentation

## 2017-10-05 DIAGNOSIS — Z7982 Long term (current) use of aspirin: Secondary | ICD-10-CM | POA: Diagnosis not present

## 2017-10-05 DIAGNOSIS — N2889 Other specified disorders of kidney and ureter: Secondary | ICD-10-CM | POA: Diagnosis not present

## 2017-10-05 HISTORY — DX: Chronic kidney disease, stage 2 (mild): N18.2

## 2017-10-05 HISTORY — DX: Personal history of pulmonary embolism: Z86.711

## 2017-10-05 HISTORY — PX: RADIOACTIVE SEED IMPLANT: SHX5150

## 2017-10-05 HISTORY — DX: Type 2 diabetes mellitus without complications: E11.9

## 2017-10-05 HISTORY — DX: Personal history of other specified conditions: Z87.898

## 2017-10-05 HISTORY — DX: Testicular hypofunction: E29.1

## 2017-10-05 HISTORY — PX: SPACE OAR INSTILLATION: SHX6769

## 2017-10-05 HISTORY — DX: Unspecified diastolic (congestive) heart failure: I50.30

## 2017-10-05 HISTORY — DX: Personal history of other diseases of male genital organs: Z87.438

## 2017-10-05 HISTORY — PX: CYSTOSCOPY: SHX5120

## 2017-10-05 HISTORY — DX: Localized edema: R60.0

## 2017-10-05 HISTORY — DX: Paroxysmal atrial fibrillation: I48.0

## 2017-10-05 HISTORY — DX: Male erectile dysfunction, unspecified: N52.9

## 2017-10-05 HISTORY — DX: Personal history of other venous thrombosis and embolism: Z86.718

## 2017-10-05 HISTORY — DX: Old myocardial infarction: I25.2

## 2017-10-05 LAB — GLUCOSE, CAPILLARY
GLUCOSE-CAPILLARY: 203 mg/dL — AB (ref 70–99)
Glucose-Capillary: 170 mg/dL — ABNORMAL HIGH (ref 70–99)

## 2017-10-05 LAB — PSA: Prostatic Specific Antigen: 2.17 ng/mL (ref 0.00–4.00)

## 2017-10-05 SURGERY — INSERTION, RADIATION SOURCE, PROSTATE
Anesthesia: General | Site: Rectum

## 2017-10-05 MED ORDER — PROPOFOL 10 MG/ML IV BOLUS
INTRAVENOUS | Status: AC
  Filled 2017-10-05: qty 20

## 2017-10-05 MED ORDER — LIDOCAINE 2% (20 MG/ML) 5 ML SYRINGE
INTRAMUSCULAR | Status: DC | PRN
  Administered 2017-10-05: 60 mg via INTRAVENOUS

## 2017-10-05 MED ORDER — TAMSULOSIN HCL 0.4 MG PO CAPS
ORAL_CAPSULE | ORAL | Status: AC
  Filled 2017-10-05: qty 1

## 2017-10-05 MED ORDER — TAMSULOSIN HCL 0.4 MG PO CAPS
0.4000 mg | ORAL_CAPSULE | Freq: Once | ORAL | Status: AC
  Administered 2017-10-05: 0.4 mg via ORAL
  Filled 2017-10-05: qty 1

## 2017-10-05 MED ORDER — FENTANYL CITRATE (PF) 100 MCG/2ML IJ SOLN
INTRAMUSCULAR | Status: AC
  Filled 2017-10-05: qty 2

## 2017-10-05 MED ORDER — FLEET ENEMA 7-19 GM/118ML RE ENEM
1.0000 | ENEMA | Freq: Once | RECTAL | Status: DC
  Filled 2017-10-05: qty 1

## 2017-10-05 MED ORDER — KETOROLAC TROMETHAMINE 30 MG/ML IJ SOLN
INTRAMUSCULAR | Status: DC | PRN
  Administered 2017-10-05: 30 mg via INTRAVENOUS

## 2017-10-05 MED ORDER — SODIUM CHLORIDE 0.9 % IR SOLN
Status: DC | PRN
  Administered 2017-10-05: 1000 mL via INTRAVESICAL

## 2017-10-05 MED ORDER — FENTANYL CITRATE (PF) 100 MCG/2ML IJ SOLN
25.0000 ug | INTRAMUSCULAR | Status: DC | PRN
  Filled 2017-10-05: qty 1

## 2017-10-05 MED ORDER — TAMSULOSIN HCL 0.4 MG PO CAPS: 0.4000 mg | ORAL_CAPSULE | Freq: Every day | ORAL | 1 refills | Status: AC

## 2017-10-05 MED ORDER — FENTANYL CITRATE (PF) 100 MCG/2ML IJ SOLN
INTRAMUSCULAR | Status: DC | PRN
  Administered 2017-10-05: 25 ug via INTRAVENOUS
  Administered 2017-10-05: 50 ug via INTRAVENOUS
  Administered 2017-10-05: 25 ug via INTRAVENOUS

## 2017-10-05 MED ORDER — PROPOFOL 10 MG/ML IV BOLUS
INTRAVENOUS | Status: DC | PRN
  Administered 2017-10-05: 170 mg via INTRAVENOUS

## 2017-10-05 MED ORDER — DEXAMETHASONE SODIUM PHOSPHATE 10 MG/ML IJ SOLN
INTRAMUSCULAR | Status: DC | PRN
  Administered 2017-10-05: 5 mg via INTRAVENOUS

## 2017-10-05 MED ORDER — SODIUM CHLORIDE 0.9 % IJ SOLN
INTRAMUSCULAR | Status: DC | PRN
  Administered 2017-10-05: 10 mL

## 2017-10-05 MED ORDER — PROMETHAZINE HCL 25 MG/ML IJ SOLN
6.2500 mg | INTRAMUSCULAR | Status: DC | PRN
  Filled 2017-10-05: qty 1

## 2017-10-05 MED ORDER — OXYCODONE HCL 5 MG/5ML PO SOLN
5.0000 mg | Freq: Once | ORAL | Status: DC | PRN
  Filled 2017-10-05: qty 5

## 2017-10-05 MED ORDER — KETOROLAC TROMETHAMINE 30 MG/ML IJ SOLN
15.0000 mg | Freq: Once | INTRAMUSCULAR | Status: AC | PRN
  Filled 2017-10-05: qty 1

## 2017-10-05 MED ORDER — LACTATED RINGERS IV SOLN
INTRAVENOUS | Status: DC
  Administered 2017-10-05 (×2): via INTRAVENOUS
  Filled 2017-10-05: qty 1000

## 2017-10-05 MED ORDER — CEPHALEXIN 500 MG PO CAPS: 500.0000 mg | ORAL_CAPSULE | Freq: Three times a day (TID) | ORAL | 0 refills | Status: AC

## 2017-10-05 MED ORDER — ONDANSETRON HCL 4 MG/2ML IJ SOLN
INTRAMUSCULAR | Status: DC | PRN
  Administered 2017-10-05: 4 mg via INTRAVENOUS

## 2017-10-05 MED ORDER — CIPROFLOXACIN IN D5W 400 MG/200ML IV SOLN
400.0000 mg | INTRAVENOUS | Status: AC
  Administered 2017-10-05: 400 mg via INTRAVENOUS
  Filled 2017-10-05: qty 200

## 2017-10-05 MED ORDER — OXYCODONE-ACETAMINOPHEN 5-325 MG PO TABS: 1.0000 | ORAL_TABLET | Freq: Four times a day (QID) | ORAL | 0 refills | Status: DC | PRN

## 2017-10-05 MED ORDER — SODIUM CHLORIDE 0.9 % IV SOLN
INTRAVENOUS | Status: DC | PRN
  Administered 2017-10-05: 50 ug/min via INTRAVENOUS

## 2017-10-05 MED ORDER — CIPROFLOXACIN IN D5W 400 MG/200ML IV SOLN
INTRAVENOUS | Status: AC
  Filled 2017-10-05: qty 200

## 2017-10-05 MED ORDER — OXYCODONE HCL 5 MG PO TABS
5.0000 mg | ORAL_TABLET | Freq: Once | ORAL | Status: DC | PRN
  Filled 2017-10-05: qty 1

## 2017-10-05 MED ORDER — IOHEXOL 300 MG/ML  SOLN
INTRAMUSCULAR | Status: DC | PRN
  Administered 2017-10-05: 7 mL

## 2017-10-05 SURGICAL SUPPLY — 44 items
BAG URINE DRAINAGE (UROLOGICAL SUPPLIES) ×4 IMPLANT
BLADE CLIPPER SURG (BLADE) ×4 IMPLANT
CATH FOLEY 2WAY SLVR  5CC 16FR (CATHETERS) ×1
CATH FOLEY 2WAY SLVR 5CC 16FR (CATHETERS) ×3 IMPLANT
CATH ROBINSON RED A/P 16FR (CATHETERS) IMPLANT
CATH ROBINSON RED A/P 20FR (CATHETERS) ×4 IMPLANT
CLOTH BEACON ORANGE TIMEOUT ST (SAFETY) ×4 IMPLANT
CONT SPECI 4OZ STER CLIK (MISCELLANEOUS) ×8 IMPLANT
COVER BACK TABLE 60X90IN (DRAPES) ×4 IMPLANT
COVER MAYO STAND STRL (DRAPES) ×4 IMPLANT
DRSG TEGADERM 4X4.75 (GAUZE/BANDAGES/DRESSINGS) ×7 IMPLANT
DRSG TEGADERM 8X12 (GAUZE/BANDAGES/DRESSINGS) ×8 IMPLANT
GAUZE SPONGE 4X4 12PLY STRL (GAUZE/BANDAGES/DRESSINGS) ×1 IMPLANT
GLOVE BIO SURGEON STRL SZ 6 (GLOVE) IMPLANT
GLOVE BIO SURGEON STRL SZ 6.5 (GLOVE) ×1 IMPLANT
GLOVE BIO SURGEON STRL SZ7 (GLOVE) IMPLANT
GLOVE BIO SURGEON STRL SZ7.5 (GLOVE) ×8 IMPLANT
GLOVE BIO SURGEON STRL SZ8 (GLOVE) ×1 IMPLANT
GLOVE BIOGEL PI IND STRL 6 (GLOVE) IMPLANT
GLOVE BIOGEL PI IND STRL 6.5 (GLOVE) IMPLANT
GLOVE BIOGEL PI IND STRL 8 (GLOVE) IMPLANT
GLOVE BIOGEL PI INDICATOR 6 (GLOVE)
GLOVE BIOGEL PI INDICATOR 6.5 (GLOVE) ×1
GLOVE BIOGEL PI INDICATOR 8 (GLOVE)
GLOVE ECLIPSE 8.0 STRL XLNG CF (GLOVE) ×4 IMPLANT
GLOVE INDICATOR 7.0 STRL GRN (GLOVE) IMPLANT
GOWN STRL REUS W/ TWL LRG LVL3 (GOWN DISPOSABLE) ×3 IMPLANT
GOWN STRL REUS W/ TWL XL LVL3 (GOWN DISPOSABLE) ×3 IMPLANT
GOWN STRL REUS W/TWL LRG LVL3 (GOWN DISPOSABLE) ×4
GOWN STRL REUS W/TWL XL LVL3 (GOWN DISPOSABLE) ×8 IMPLANT
HOLDER FOLEY CATH W/STRAP (MISCELLANEOUS) ×4 IMPLANT
I-SEED AGX100 ×82 IMPLANT
IMPL SPACEOAR SYSTEM 10ML (MISCELLANEOUS) ×3 IMPLANT
IMPLANT SPACEOAR SYSTEM 10ML (MISCELLANEOUS) ×4
IV NS 1000ML (IV SOLUTION) ×4
IV NS 1000ML BAXH (IV SOLUTION) ×3 IMPLANT
KIT TURNOVER CYSTO (KITS) ×4 IMPLANT
MARKER SKIN DUAL TIP RULER LAB (MISCELLANEOUS) ×4 IMPLANT
PACK CYSTO (CUSTOM PROCEDURE TRAY) ×4 IMPLANT
SURGILUBE 2OZ TUBE FLIPTOP (MISCELLANEOUS) ×4 IMPLANT
SYR 10ML LL (SYRINGE) ×5 IMPLANT
TUBE CONNECTING 12X1/4 (SUCTIONS) IMPLANT
UNDERPAD 30X30 (UNDERPADS AND DIAPERS) ×8 IMPLANT
WATER STERILE IRR 500ML POUR (IV SOLUTION) ×4 IMPLANT

## 2017-10-05 NOTE — Transfer of Care (Signed)
Immediate Anesthesia Transfer of Care Note  Patient: Patrick Massey  Procedure(s) Performed: RADIOACTIVE SEED IMPLANT/BRACHYTHERAPY IMPLANT (N/A Prostate) SPACE OAR INSTILLATION (N/A Rectum) CYSTOSCOPY (N/A Bladder)  Patient Location: PACU  Anesthesia Type:General  Level of Consciousness: awake, alert , oriented and patient cooperative  Airway & Oxygen Therapy: Patient Spontanous Breathing and Patient connected to nasal cannula oxygen  Post-op Assessment: Report given to RN and Post -op Vital signs reviewed and stable  Post vital signs: Reviewed and stable  Last Vitals:  Vitals Value Taken Time  BP    Temp    Pulse 57 10/05/2017  2:17 PM  Resp    SpO2 96 % 10/05/2017  2:17 PM  Vitals shown include unvalidated device data.  Last Pain:  Vitals:   10/05/17 1037  TempSrc:   PainSc: 0-No pain      Patients Stated Pain Goal: 4 (15/40/08 6761)  Complications: No apparent anesthesia complications
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
## 2017-10-05 NOTE — Discharge Instructions (Signed)
Brachytherapy for Prostate Cancer, Care After Refer to this sheet in the next few weeks. These instructions provide you with information on caring for yourself after your procedure. Your health care provider may also give you more specific instructions. Your treatment has been planned according to current medical practices, but problems sometimes occur. Call your health care provider if you have any problems or questions after your procedure. What can I expect after the procedure? The area behind the scrotum will probably be tender and bruised. For a short period of time you may have:  Difficulty passing urine. You may need a catheter for a few days to a month.  Blood in the urine or semen.  A feeling of constipation because of prostate swelling.  Frequent feeling of an urgent need to urinate.  For a long period of time you may have:  Inflammation of the rectum. This happens in about 2% of people who have the procedure.  Erection problems. These vary with age and occur in about 15-40% of men.  Difficulty urinating. This is caused by scarring in the urethra.  Diarrhea.  Follow these instructions at home:  Take medicines only as directed by your health care provider.  You will probably have a catheter in your bladder for several days. You will have blood in the urine bag and should drink a lot of fluids to keep it a light red color.  Keep all follow-up visits as directed by your health care provider. If you have a catheter, it will be removed during one of these visits.  Try not to sit directly on the area behind the scrotum. A soft cushion can decrease the discomfort. Ice packs may also be helpful for the discomfort. Do not put ice directly on the skin.  Shower and wash the area behind the scrotum gently. Do not sit in a tub.  If you have had the brachytherapy that uses the seeds, limit your close contact with children and pregnant women for 2 months because of the radiation still  in the prostate. After that period of time, the levels drop off quickly. Get help right away if:  You have a fever.  You have chills.  You have shortness of breath.  You have chest pain.  You have thick blood, like tomato juice, in the urine bag.  Your catheter is blocked so urine cannot get into the bag. Your bladder area or lower abdomen may be swollen.  There is excessive bleeding from your rectum. It is normal to have a little blood mixed with your stool.  There is severe discomfort in the treated area that does not go away with pain medicine.  You have abdominal discomfort.  You have severe nausea or vomiting.  You develop any new or unusual symptoms. This information is not intended to replace advice given to you by your health care provider. Make sure you discuss any questions you have with your health care provider. Document Released: 02/21/2010 Document Revised: 07/03/2015 Document Reviewed: 07/12/2012 Elsevier Interactive Patient Education  2017 [facility] Instructions  Activity: Get plenty of rest for the remainder of the day. A responsible individual must stay with you for 24 hours following the procedure.  For the next 24 hours, DO NOT: -Drive a car -Paediatric nurse -Drink alcoholic beverages -Take any medication unless instructed by your physician -Make any legal decisions or sign important papers.  Meals: Start with liquid foods such as gelatin or soup. Progress to  regular foods as tolerated. Avoid greasy, spicy, heavy foods. If nausea and/or vomiting occur, drink only clear liquids until the nausea and/or vomiting subsides. Call your physician if vomiting continues.  Special Instructions/Symptoms: Your throat may feel dry or sore from the anesthesia or the breathing tube placed in your throat during surgery. If this causes discomfort, gargle with warm salt water. The discomfort should disappear within 24 hours.  May  take Ibuprofen, Motrin, Advil, or Aleve after 4 PM.

## 2017-10-05 NOTE — Anesthesia Procedure Notes (Signed)
Procedure Name: LMA Insertion Date/Time: 10/05/2017 12:45 PM Performed by: Wanita Chamberlain, CRNA Pre-anesthesia Checklist: Patient identified, Emergency Drugs available, Suction available, Patient being monitored and Timeout performed Patient Re-evaluated:Patient Re-evaluated prior to induction Oxygen Delivery Method: Circle system utilized Preoxygenation: Pre-oxygenation with 100% oxygen Induction Type: IV induction Ventilation: Mask ventilation without difficulty LMA: LMA inserted LMA Size: 5.0 Number of attempts: 1 Placement Confirmation: breath sounds checked- equal and bilateral,  CO2 detector and positive ETCO2 Tube secured with: Tape Dental Injury: Teeth and Oropharynx as per pre-operative assessment
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
## 2017-10-05 NOTE — Progress Notes (Signed)
  Radiation Oncology         [phone] ________________________________  Name: Patrick Massey MRN: [id number]  Date: 10/06/2017  DOB: January 16, 1941       Prostate Seed Implant  YN:[id number], Patrick Mech, MD  No ref. provider found  DIAGNOSIS: 77 y.o. male with Stage T1c adenocarcinoma of the prostate with Gleason Score of 3+3, and PSA of 16.30.    ICD-10-CM   1. Prostate cancer ([place]) C61 CANCELED: DG Chest 2 View    CANCELED: DG Chest 2 View    PROCEDURE: Insertion of radioactive I-125 seeds into the prostate gland.  RADIATION DOSE: 145 Gy, definitive therapy.  TECHNIQUE: Patrick Massey was brought to the operating room with the urologist. He was placed in the dorsolithotomy position. He was catheterized and a rectal tube was inserted. The perineum was shaved, prepped and draped. The ultrasound probe was then introduced into the rectum to see the prostate gland.  TREATMENT DEVICE: A needle grid was attached to the ultrasound probe stand and anchor needles were placed.  3D PLANNING: The prostate was imaged in 3D using a sagittal sweep of the prostate probe. These images were transferred to the planning computer. There, the prostate, urethra and rectum were defined on each axial reconstructed image. Then, the software created an optimized 3D plan and a few seed positions were adjusted. The quality of the plan was reviewed using [facility] information for the target and the following two organs at risk:  Urethra and Rectum.  Then the accepted plan was printed and handed off to the radiation therapist.  Under my supervision, the custom loading of the seeds and spacers was carried out and loaded into sealed vicryl sleeves.  These pre-loaded needles were then placed into the needle holder.Marland Kitchen  PROSTATE VOLUME STUDY:  Using transrectal ultrasound the volume of the prostate was verified to be 46.5 cc.  SPECIAL TREATMENT PROCEDURE/SUPERVISION AND HANDLING: The pre-loaded needles were then delivered under sagittal  guidance. A total of 27 needles were used to deposit 82 seeds in the prostate gland. The individual seed activity was 0.461 mCi.  SpaceOAR:  Yes  COMPLEX SIMULATION: At the end of the procedure, an anterior radiograph of the pelvis was obtained to document seed positioning and count. Cystoscopy was performed to check the urethra and bladder.  MICRODOSIMETRY: At the end of the procedure, the patient was emitting 0.16 mR/hr at 1 meter. Accordingly, he was considered safe for hospital discharge.  PLAN: The patient will return to the radiation oncology clinic for post implant CT dosimetry in three weeks.   ________________________________  Sheral Apley Tammi Klippel, M.D.

## 2017-10-05 NOTE — Op Note (Signed)
Preoperative diagnosis: Stage I (T1cNxMx) Prostate cancer Postoperative diagnosis: Same  Procedure: Prostate brachytherapy seed implant, injection of biodegradable gel, Cystoscopy  Surgeon: Junious Silk  Radiation oncologist: Tammi Klippel  Anesthesia: Gen.  Indication for procedure: 77 year old with stage I prostate cancer who elected to proceed with prostate brachytherapy.  Findings: On fluoroscopic imaging there was adequate coverage of the prostate. On cystoscopy the urethra appeared normal, the prostatic urethra appeared normal, the trigone and ureteral orifices appeared normal with clear efflux. The bladder mucosa appeared normal. There were no stones, foreign bodies or seeds visualized in the bladder.  Dose: 145.0000 Gy (catheters 27, active sources 82)  Description of procedure: After consent was obtained patient brought to the operating room. After adequate anesthesia he is placed in lithotomy position and the transrectal ultrasound probe and perineal template positioned. Catheters and brachytherapy seeds were placed per Dr. Johny Shears plan. A total of 27 catheters and 82 active sources (I-125) were placed. The anchoring needles, template and ultrasound were removed. Scout flouro imaging was obtained of the implant. The Foley was removed. Another scout image was obtained.   The 18-gauge needle was then inserted approximately 1 to 2 cm anterior to the anal opening and directed under fluoroscopic guidance into the perirectal fat between the anterior rectal wall and the prostate capsule down to the mid-gland. Midline needle position was confirmed in the sagittal and axial views to verify the tip was in the perirectal fat.  Small amounts of saline were injected to hydrodissect the space between the prostate and the anterior rectal wall.  Axial imaging was viewed to confirm the needle was in the correct location in the mid gland and centered.  Aspiration confirmed no intravascular access.  The  saline syringe was carefully disconnected maintaining the desired needle position and the hydrogel was attached to the needle.  Under ultrasound guidance in the sagittal view a smooth continuous injection was done over 10 - 12 seconds delivering the hydrogel into the space between the prostate and rectal wall.  The needle was withdrawn.  The patient was prepped again and cystoscopy was performed which was noted to be normal. He was awakened taken to the recovery room in stable condition.   Complications: None  Blood loss: Minimal  Specimens: None  Drains: none  Disposition: Patient stable to PACU.

## 2017-10-05 NOTE — Anesthesia Postprocedure Evaluation (Signed)
Anesthesia Post Note  Patient: Duran Ohern Gowan  Procedure(s) Performed: RADIOACTIVE SEED IMPLANT/BRACHYTHERAPY IMPLANT (N/A Prostate) SPACE OAR INSTILLATION (N/A Rectum) CYSTOSCOPY (N/A Bladder)     Patient location during evaluation: PACU Anesthesia Type: General Level of consciousness: awake and alert Pain management: pain level controlled Vital Signs Assessment: post-procedure vital signs reviewed and stable Respiratory status: spontaneous breathing, nonlabored ventilation, respiratory function stable and patient connected to nasal cannula oxygen Cardiovascular status: blood pressure returned to baseline and stable Postop Assessment: no apparent nausea or vomiting Anesthetic complications: no    Last Vitals:  Vitals:   10/05/17 1445 10/05/17 1500  BP: 114/63   Pulse: (!) 52 (!) 53  Resp: 13 15  Temp:    SpO2: 95% 93%    Last Pain:  Vitals:   10/05/17 1415  TempSrc:   PainSc: 0-No pain                 Effie Berkshire

## 2017-10-06 ENCOUNTER — Other Ambulatory Visit: Payer: Self-pay | Admitting: Cardiology

## 2017-10-06 ENCOUNTER — Encounter (HOSPITAL_BASED_OUTPATIENT_CLINIC_OR_DEPARTMENT_OTHER): Payer: Self-pay | Admitting: Urology

## 2017-10-06 NOTE — Telephone Encounter (Signed)
Rx request sent to pharmacy.  

## 2017-10-08 DIAGNOSIS — Z5181 Encounter for therapeutic drug level monitoring: Secondary | ICD-10-CM | POA: Diagnosis not present

## 2017-10-08 DIAGNOSIS — I2510 Atherosclerotic heart disease of native coronary artery without angina pectoris: Secondary | ICD-10-CM | POA: Diagnosis not present

## 2017-10-08 DIAGNOSIS — B353 Tinea pedis: Secondary | ICD-10-CM | POA: Diagnosis not present

## 2017-10-08 DIAGNOSIS — I10 Essential (primary) hypertension: Secondary | ICD-10-CM | POA: Diagnosis not present

## 2017-10-08 DIAGNOSIS — E669 Obesity, unspecified: Secondary | ICD-10-CM | POA: Diagnosis not present

## 2017-10-08 DIAGNOSIS — R609 Edema, unspecified: Secondary | ICD-10-CM | POA: Diagnosis not present

## 2017-10-08 DIAGNOSIS — E1165 Type 2 diabetes mellitus with hyperglycemia: Secondary | ICD-10-CM | POA: Diagnosis not present

## 2017-10-12 ENCOUNTER — Other Ambulatory Visit: Payer: Medicare HMO

## 2017-10-12 ENCOUNTER — Encounter: Payer: Self-pay | Admitting: Diagnostic Neuroimaging

## 2017-10-12 ENCOUNTER — Telehealth: Payer: Self-pay | Admitting: Diagnostic Neuroimaging

## 2017-10-12 ENCOUNTER — Ambulatory Visit (INDEPENDENT_AMBULATORY_CARE_PROVIDER_SITE_OTHER): Payer: Medicare HMO | Admitting: Diagnostic Neuroimaging

## 2017-10-12 VITALS — BP 92/63 | HR 86 | Ht 68.0 in | Wt 227.4 lb

## 2017-10-12 DIAGNOSIS — R569 Unspecified convulsions: Secondary | ICD-10-CM | POA: Diagnosis not present

## 2017-10-12 DIAGNOSIS — G450 Vertebro-basilar artery syndrome: Secondary | ICD-10-CM | POA: Diagnosis not present

## 2017-10-12 MED ORDER — LEVETIRACETAM 500 MG PO TABS: 500.0000 mg | ORAL_TABLET | Freq: Two times a day (BID) | ORAL | 11 refills | Status: DC

## 2017-10-12 NOTE — Patient Instructions (Signed)
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  - Please maintain precautions. Do not participate in activities where a loss of awareness could harm you or someone else. No swimming alone, no tub bathing, no hot tubs, no driving, no operating motorized vehicles (cars, ATVs, motocycles, etc), lawnmowers, power tools or firearms. No standing at heights, such as rooftops, ladders or stairs. Avoid hot objects such as stoves, heaters, open fires. Wear a helmet when riding a bicycle, scooter, skateboard, etc. and avoid areas of traffic. Set your water heater to 120 degrees or less.
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
## 2017-10-12 NOTE — Telephone Encounter (Signed)
[employer] medicare order sent to [facility]. They will obtain the auth and will reach out to the pt to schedule.  °

## 2017-10-14 ENCOUNTER — Ambulatory Visit
Admission: RE | Admit: 2017-10-14 | Discharge: 2017-10-14 | Disposition: A | Discharge status: Home / Self Care | Payer: Medicare HMO | Source: Ambulatory Visit | Attending: Radiation Oncology | Admitting: Radiation Oncology

## 2017-10-14 ENCOUNTER — Other Ambulatory Visit: Payer: Self-pay

## 2017-10-14 ENCOUNTER — Encounter: Payer: Self-pay | Admitting: Medical Oncology

## 2017-10-14 ENCOUNTER — Encounter: Payer: Self-pay | Admitting: Radiation Oncology

## 2017-10-14 DIAGNOSIS — C61 Malignant neoplasm of prostate: Secondary | ICD-10-CM | POA: Insufficient documentation

## 2017-10-14 DIAGNOSIS — Z888 Allergy status to other drugs, medicaments and biological substances status: Secondary | ICD-10-CM | POA: Insufficient documentation

## 2017-10-14 DIAGNOSIS — Z7984 Long term (current) use of oral hypoglycemic drugs: Secondary | ICD-10-CM | POA: Insufficient documentation

## 2017-10-14 DIAGNOSIS — Z923 Personal history of irradiation: Secondary | ICD-10-CM | POA: Insufficient documentation

## 2017-10-14 DIAGNOSIS — Z7982 Long term (current) use of aspirin: Secondary | ICD-10-CM | POA: Insufficient documentation

## 2017-10-14 DIAGNOSIS — Z79899 Other long term (current) drug therapy: Secondary | ICD-10-CM | POA: Insufficient documentation

## 2017-10-14 NOTE — Progress Notes (Signed)
Radiation Oncology         [phone] ________________________________  Name: Patrick Massey MRN: [id number]  Date: 10/14/2017  DOB: Jul 13, 1940  Post-Seed Follow-Up Visit Note  CC: Glendale Chard, MD  Festus Aloe, MD  Diagnosis:   77 y.o. male with Stage T1c adenocarcinoma of the prostate with Gleason Score of 3+3, and PSA of 16.30     ICD-10-CM   1. Malignant neoplasm of prostate ([place]) C61     Interval Since Last Radiation:  1 week - Insertion of radioactive I-125 seeds into the prostate gland, 145 Gy definitive therapy, with SpaceOAR  Narrative:  The patient returns today for routine follow-up.  He is complaining of increased urinary frequency and urinary hesitation symptoms with intermittency, weak stream and nocturia x2. He filled out a questionnaire regarding urinary function today providing an overall IPSS score of 21 characterizing his symptoms as moderate.  His pre-implant score was 15. He denies any bowel symptoms and reports that his LUTS are tolerable.   ALLERGIES:  is allergic to heparin.  Meds: Current Outpatient Medications  Medication Sig Dispense Refill  . aspirin EC 81 MG EC tablet Take 1 tablet (81 mg total) by mouth daily. 30 tablet 0  . atorvastatin (LIPITOR) 80 MG tablet Take 1 tablet (80 mg total) by mouth daily. (Patient taking differently: Take 80 mg by mouth every morning. ) 90 tablet 3  . carvedilol (COREG) 12.5 MG tablet Take 12.5 mg by mouth 2 (two) times daily with a meal.    . carvedilol (COREG) 12.5 MG tablet TAKE 1 TABLET (12.5 MG TOTAL) BY MOUTH 2 (TWO) TIMES DAILY. 180 tablet 0  . cephALEXin (KEFLEX) 500 MG capsule Take 1 capsule (500 mg total) by mouth 3 (three) times daily for 10 days. (Patient taking differently: Take 500 mg by mouth 2 (two) times daily. ) 15 capsule 0  . diltiazem (CARDIZEM CD) 240 MG 24 hr capsule Take 1 capsule (240 mg total) by mouth daily. (Patient taking differently: Take 240 mg by mouth every morning. ) 90 capsule 3    . glimepiride (AMARYL) 4 MG tablet Take 4 mg by mouth Twice daily.     Marland Kitchen glipiZIDE (GLUCOTROL XL) 2.5 MG 24 hr tablet Take 2.5 mg by mouth 2 (two) times daily.     Marland Kitchen JARDIANCE 10 MG TABS tablet     . levETIRAcetam (KEPPRA) 500 MG tablet Take 1 tablet (500 mg total) by mouth 2 (two) times daily. 120 tablet 11  . metFORMIN (GLUCOPHAGE) 1000 MG tablet Take 1,000 mg by mouth 2 (two) times daily with a meal.     . ONETOUCH DELICA LANCETS 78G MISC     . ONETOUCH VERIO test strip USE AS DIRECTED TO CHECK BLOOD SUGARS 1 TIME PER DAYDX: E11.65  11  . oxyCODONE-acetaminophen (PERCOCET) 5-325 MG tablet Take 1-2 tablets by mouth every 6 (six) hours as needed for severe pain. 12 tablet 0  . tamsulosin (FLOMAX) 0.4 MG CAPS capsule Take 1 capsule (0.4 mg total) by mouth daily after supper. 30 capsule 1  . valsartan (DIOVAN) 160 MG tablet Take 160 mg by mouth every morning.     . Vitamin D, Ergocalciferol, (DRISDOL) 50000 UNITS CAPS capsule Take 50,000 Units by mouth 2 (two) times a week.   1   No current facility-administered medications for this encounter.     Physical Findings: In general this is a well appearing African-American male in no acute distress. He's alert and oriented x4 and appropriate  throughout the examination. Cardiopulmonary assessment is negative for acute distress and he exhibits normal effort.   Lab Findings: Lab Results  Component Value Date   WBC 6.6 09/28/2017   HGB 13.1 09/28/2017   HCT 41.2 09/28/2017   MCV 84.6 09/28/2017   PLT 216 09/28/2017    Radiographic Findings:  Patient underwent CT imaging in our clinic for post implant dosimetry. The CT was reviewed by Dr. Tammi Klippel and appears to demonstrate an adequate distribution of radioactive seeds throughout the prostate gland. There are no seeds in or near the rectum. We suspect the final radiation plan and dosimetry will show appropriate coverage of the prostate gland.   Impression/Plan: The patient is recovering from the  effects of radiation. His urinary symptoms should gradually improve over the next 4-6 months. We talked about this today. He is encouraged by his improvement already and is otherwise pleased with his outcome. We also talked about long-term follow-up for prostate cancer following seed implant. He understands that ongoing PSA determinations and digital rectal exams will help perform surveillance to rule out disease recurrence. He has a follow up appointment scheduled with Dr. Junious Silk on 10/19/2017 as well as a follow-up visit for repeat PSA in December 2019. He understands what to expect with his PSA measures. Patient was also educated today about some of the long-term effects from radiation including a small risk for rectal bleeding and possibly erectile dysfunction. We talked about some of the general management approaches to these potential complications. However, I did encourage the patient to contact our office or return at any point if he has questions or concerns related to his previous radiation and prostate cancer.    Nicholos Johns, PA-C    Tyler Pita, MD  [facility] Direct Dial: [phone]  Fax: [phone] [email or web address]  Skype  LinkedIn    Page Me   This document serves as a record of services personally performed by Tyler Pita, MD and Freeman Caldron, PA-C. It was created on their behalf by Rae Lips, a trained medical scribe. The creation of this record is based on the scribe's personal observations and the providers' statements to them. This document has been checked and approved by the attending providers.

## 2017-10-14 NOTE — Progress Notes (Signed)
Pre seed IPSS 15. Post seed IPSS 21. Denies dysuria, hematuria, urinary leakage or incontinence. Scheduled to see urologist for follow up on 10/19/2017. MRI not scheduled because peer to peer required for insurance approval.   BP (!) 155/97 (BP Location: Right Arm, Patient Position: Sitting)   Pulse 97   Temp 97.6 F (36.4 C) (Oral)   Resp 20   Ht 5\' 8"  (1.727 m)   Wt 227 lb 3.2 oz (103.1 kg)   SpO2 100%   BMI 34.55 kg/m  Wt Readings from Last 3 Encounters:  10/14/17 227 lb 3.2 oz (103.1 kg)  10/12/17 227 lb 6.4 oz (103.1 kg)  10/05/17 224 lb 1.6 oz (101.7 kg)

## 2017-10-14 NOTE — Progress Notes (Signed)
  Radiation Oncology         [phone] ________________________________  Name: Patrick Massey MRN: [id number]  Date: 10/14/2017  DOB: 1941-01-10  COMPLEX SIMULATION NOTE  NARRATIVE:  The patient was brought to the [facility] today following prostate seed implantation approximately one month ago.  Identity was confirmed.  All relevant records and images related to the planned course of therapy were reviewed.  Then, the patient was set-up supine.  CT images were obtained.  The CT images were loaded into the planning software.  Then the prostate and rectum were contoured.  Treatment planning then occurred.  The implanted iodine 125 seeds were identified by the physics staff for projection of radiation distribution  I have requested : 3D Simulation  I have requested a DVH of the following structures: Prostate and rectum.    ________________________________  Sheral Apley Tammi Klippel, M.D.  This document serves as a record of services personally performed by Tyler Pita, MD. It was created on his behalf by Rae Lips, a trained medical scribe. The creation of this record is based on the scribe's personal observations and the provider's statements to them. This document has been checked and approved by the attending provider.

## 2017-10-19 ENCOUNTER — Ambulatory Visit: Payer: Medicare HMO | Admitting: Diagnostic Neuroimaging

## 2017-10-19 DIAGNOSIS — R569 Unspecified convulsions: Secondary | ICD-10-CM | POA: Diagnosis not present

## 2017-10-19 DIAGNOSIS — C61 Malignant neoplasm of prostate: Secondary | ICD-10-CM | POA: Diagnosis not present

## 2017-10-25 ENCOUNTER — Ambulatory Visit (HOSPITAL_COMMUNITY)
Admission: RE | Admit: 2017-10-25 | Discharge: 2017-10-25 | Disposition: A | Discharge status: Home / Self Care | Payer: Medicare HMO | Source: Ambulatory Visit | Attending: Urology | Admitting: Urology

## 2017-10-25 DIAGNOSIS — C61 Malignant neoplasm of prostate: Secondary | ICD-10-CM | POA: Insufficient documentation

## 2017-10-26 NOTE — Procedures (Signed)
   [facility]  EEG (ELECTROENCEPHALOGRAM) REPORT   STUDY DATE: 10/19/17 PATIENT NAME: Patrick Massey DOB: 05/16/1940 MRN: [id number]  ORDERING CLINICIAN: Andrey Spearman, MD   TECHNOLOGIST: Shawn Stall HAndy  TECHNIQUE: Electroencephalogram was recorded utilizing standard 10-20 system of lead placement and reformatted into average and bipolar montages.  RECORDING TIME: 20 minutes  ACTIVATION: hyperventilation and photic stimulation  CLINICAL INFORMATION: 77 year old male with recurrent syncope vs seizure.  FINDINGS: Posterior dominant background rhythms, which attenuate with eye opening, ranging 10-11 hertz and 15-20 microvolts. No focal, lateralizing, epileptiform activity or seizures are seen. Patient recorded in the awake and drowsy state. EKG channel shows IRREGULAR rhythm of 50-60 beats per minute.   IMPRESSION:   Normal EEG in the awake and drowsy states.   Of note, there is IRREGULAR rhythm of 50-60 beats per minute. Correlate clinically.     INTERPRETING PHYSICIAN:  Penni Bombard, MD Certified in Neurology, Neurophysiology and Neuroimaging  [facility] [address] [place], [place] [address] [phone]

## 2017-11-09 ENCOUNTER — Encounter: Payer: Self-pay | Admitting: Radiation Oncology

## 2017-11-09 DIAGNOSIS — C61 Malignant neoplasm of prostate: Secondary | ICD-10-CM | POA: Diagnosis not present

## 2017-11-11 ENCOUNTER — Telehealth: Payer: Self-pay | Admitting: *Deleted

## 2017-11-11 NOTE — Telephone Encounter (Signed)
-----   Message from Penni Bombard, MD sent at 11/10/2017  5:58 PM EDT ----- Normal EEG brain waves. IRREGULAR RHYTHM NOTED on EKG channel. Follow up with PCP or cardiology. Otherwise continue current plan. -VRP

## 2017-11-11 NOTE — Telephone Encounter (Signed)
Called pt and LVM asking for call back. Left office number in message.

## 2017-11-15 NOTE — Telephone Encounter (Signed)
Patient is returning a call. °

## 2017-11-15 NOTE — Telephone Encounter (Signed)
Patient returned call. This RN repeated the results and asked when his next appt with PCP. He stated he has a follow up with PCP in Nov. This RN routed result to PCP, Dr Johnnye Lana.  This RN advised he has FU in Dec and will get reminder call. Reviewed Dr Gladstone Lighter plan, recommendations, instructions per last office note. Also advised he call sooner for any questions, problems, concerns or seizure activity.   He verbalized understanding, appreciation.

## 2017-11-15 NOTE — Telephone Encounter (Signed)
LVM informing patient that his EEG shows normal brain waves. Advised him his EKG showed an irregular rhythm. Strongly advised he follow up with his PCP or cardiology. Left number and requested he call back to discuss.

## 2017-11-24 ENCOUNTER — Telehealth: Payer: Self-pay | Admitting: *Deleted

## 2017-11-24 NOTE — Telephone Encounter (Signed)
Patient was seen by Janan Ridge PA in June and was to follow up in 6 months. Scheduled appointment for 01/05/18 at 10:40 with Dr Martinique. Left message to call back

## 2017-11-29 NOTE — Progress Notes (Signed)
  Radiation Oncology         [phone] ________________________________  Name: Patrick Massey MRN: [id number]  Date: 11/09/2017  DOB: Dec 11, 1940  3D Planning Note   Prostate Brachytherapy Post-Implant Dosimetry  Diagnosis: 77 y.o. male with Stage T1c adenocarcinoma of the prostate with Gleason Score of 3+3, and PSA of 16.30  Narrative: On a previous date, Patrick Massey returned following prostate seed implantation for post implant planning. He underwent CT scan complex simulation to delineate the three-dimensional structures of the pelvis and demonstrate the radiation distribution.  Since that time, the seed localization, and complex isodose planning with dose volume histograms have now been completed.  Results:   Prostate Coverage - The dose of radiation delivered to the 90% or more of the prostate gland (D90) was 114.6% of the prescription dose. This exceeds our goal of greater than 90%. Rectal Sparing - The volume of rectal tissue receiving the prescription dose or higher was 0.0 cc. This falls under our thresholds tolerance of 1.0 cc.  Impression: The prostate seed implant appears to show adequate target coverage and appropriate rectal sparing.  Plan:  The patient will continue to follow with urology for ongoing PSA determinations. I would anticipate a high likelihood for local tumor control with minimal risk for rectal morbidity.  ________________________________  Sheral Apley Tammi Klippel, M.D.

## 2017-12-01 NOTE — Telephone Encounter (Signed)
Patient has f/u in December with Dr Martinique

## 2017-12-02 NOTE — Telephone Encounter (Signed)
Patient returning call. Please call back

## 2017-12-02 NOTE — Telephone Encounter (Signed)
Patient aware of appointment date and time. 

## 2017-12-16 ENCOUNTER — Ambulatory Visit (INDEPENDENT_AMBULATORY_CARE_PROVIDER_SITE_OTHER): Payer: Medicare HMO

## 2017-12-16 ENCOUNTER — Encounter: Payer: Self-pay | Admitting: Nurse Practitioner

## 2017-12-16 ENCOUNTER — Ambulatory Visit (INDEPENDENT_AMBULATORY_CARE_PROVIDER_SITE_OTHER): Payer: Medicare HMO | Admitting: Nurse Practitioner

## 2017-12-16 VITALS — BP 110/68 | HR 60 | Temp 97.3°F | Ht 65.0 in | Wt 222.8 lb

## 2017-12-16 DIAGNOSIS — E119 Type 2 diabetes mellitus without complications: Secondary | ICD-10-CM | POA: Diagnosis not present

## 2017-12-16 DIAGNOSIS — Z0000 Encounter for general adult medical examination without abnormal findings: Secondary | ICD-10-CM

## 2017-12-16 DIAGNOSIS — I2510 Atherosclerotic heart disease of native coronary artery without angina pectoris: Secondary | ICD-10-CM | POA: Diagnosis not present

## 2017-12-16 DIAGNOSIS — N182 Chronic kidney disease, stage 2 (mild): Secondary | ICD-10-CM | POA: Diagnosis not present

## 2017-12-16 DIAGNOSIS — I1310 Hypertensive heart and chronic kidney disease without heart failure, with stage 1 through stage 4 chronic kidney disease, or unspecified chronic kidney disease: Secondary | ICD-10-CM

## 2017-12-16 DIAGNOSIS — N289 Disorder of kidney and ureter, unspecified: Secondary | ICD-10-CM

## 2017-12-16 DIAGNOSIS — Z7982 Long term (current) use of aspirin: Secondary | ICD-10-CM

## 2017-12-16 LAB — POCT URINALYSIS DIPSTICK
BILIRUBIN UA: NEGATIVE
Glucose, UA: POSITIVE — AB
Ketones, UA: NEGATIVE
Leukocytes, UA: NEGATIVE
Nitrite, UA: NEGATIVE
Protein, UA: NEGATIVE
RBC UA: NEGATIVE
SPEC GRAV UA: 1.025 (ref 1.010–1.025)
Urobilinogen, UA: 0.2 E.U./dL
pH, UA: 5 (ref 5.0–8.0)

## 2017-12-16 LAB — POCT UA - MICROALBUMIN
Albumin/Creatinine Ratio, Urine, POC: 30
CREATININE, POC: 200 mg/dL
MICROALBUMIN (UR) POC: 30 mg/L

## 2017-12-16 NOTE — Patient Instructions (Signed)
Patrick Massey , Thank you for taking time to come for your Medicare Wellness Visit. I appreciate your ongoing commitment to your health goals. Please review the following plan we discussed and let me know if I can assist you in the future.   Screening recommendations/referrals: Colonoscopy: not required Recommended yearly ophthalmology/optometry visit for glaucoma screening and checkup Recommended yearly dental visit for hygiene and checkup  Vaccinations: Influenza vaccine: 12/2017 Pneumococcal vaccine: 11/2015 Tdap vaccine: 11/2009 Shingles vaccine: dec    Advanced directives:Please bring a copy of your POA (Power of Attorney) and/or Living Will to your next appointment.    Conditions/risks identified: Obesity  Next appointment: 03/17/2018 at 2:30  Preventive Care 65 Years and Older, Male Preventive care refers to lifestyle choices and visits with your health care provider that can promote health and wellness. What does preventive care include?  A yearly physical exam. This is also called an annual well check.  Dental exams once or twice a year.  Routine eye exams. Ask your health care provider how often you should have your eyes checked.  Personal lifestyle choices, including:  Daily care of your teeth and gums.  Regular physical activity.  Eating a healthy diet.  Avoiding tobacco and drug use.  Limiting alcohol use.  Practicing safe sex.  Taking low doses of aspirin every day.  Taking vitamin and mineral supplements as recommended by your health care provider. What happens during an annual well check? The services and screenings done by your health care provider during your annual well check will depend on your age, overall health, lifestyle risk factors, and family history of disease. Counseling  Your health care provider may ask you questions about your:  Alcohol use.  Tobacco use.  Drug use.  Emotional well-being.  Home and relationship  well-being.  Sexual activity.  Eating habits.  History of falls.  Memory and ability to understand (cognition).  Work and work Statistician. Screening  You may have the following tests or measurements:  Height, weight, and BMI.  Blood pressure.  Lipid and cholesterol levels. These may be checked every 5 years, or more frequently if you are over 19 years old.  Skin check.  Lung cancer screening. You may have this screening every year starting at age 66 if you have a 30-pack-year history of smoking and currently smoke or have quit within the past 15 years.  Fecal occult blood test (FOBT) of the stool. You may have this test every year starting at age 12.  Flexible sigmoidoscopy or colonoscopy. You may have a sigmoidoscopy every 5 years or a colonoscopy every 10 years starting at age 51.  Prostate cancer screening. Recommendations will vary depending on your family history and other risks.  Hepatitis C blood test.  Hepatitis B blood test.  Sexually transmitted disease (STD) testing.  Diabetes screening. This is done by checking your blood sugar (glucose) after you have not eaten for a while (fasting). You may have this done every 1-3 years.  Abdominal aortic aneurysm (AAA) screening. You may need this if you are a current or former smoker.  Osteoporosis. You may be screened starting at age 85 if you are at high risk. Talk with your health care provider about your test results, treatment options, and if necessary, the need for more tests. Vaccines  Your health care provider may recommend certain vaccines, such as:  Influenza vaccine. This is recommended every year.  Tetanus, diphtheria, and acellular pertussis (Tdap, Td) vaccine. You may need a Td booster every  10 years.  Zoster vaccine. You may need this after age 70.  Pneumococcal 13-valent conjugate (PCV13) vaccine. One dose is recommended after age 67.  Pneumococcal polysaccharide (PPSV23) vaccine. One dose is  recommended after age 6. Talk to your health care provider about which screenings and vaccines you need and how often you need them. This information is not intended to replace advice given to you by your health care provider. Make sure you discuss any questions you have with your health care provider. Document Released: 02/15/2015 Document Revised: 10/09/2015 Document Reviewed: 11/20/2014 Elsevier Interactive Patient Education  2017 [place] Prevention in the Home Falls can cause injuries. They can happen to people of all ages. There are many things you can do to make your home safe and to help prevent falls. What can I do on the outside of my home?  Regularly fix the edges of walkways and driveways and fix any cracks.  Remove anything that might make you trip as you walk through a door, such as a raised step or threshold.  Trim any bushes or trees on the path to your home.  Use bright outdoor lighting.  Clear any walking paths of anything that might make someone trip, such as rocks or tools.  Regularly check to see if handrails are loose or broken. Make sure that both sides of any steps have handrails.  Any raised decks and porches should have guardrails on the edges.  Have any leaves, snow, or ice cleared regularly.  Use sand or salt on walking paths during winter.  Clean up any spills in your garage right away. This includes oil or grease spills. What can I do in the bathroom?  Use night lights.  Install grab bars by the toilet and in the tub and shower. Do not use towel bars as grab bars.  Use non-skid mats or decals in the tub or shower.  If you need to sit down in the shower, use a plastic, non-slip stool.  Keep the floor dry. Clean up any water that spills on the floor as soon as it happens.  Remove soap buildup in the tub or shower regularly.  Attach bath mats securely with double-sided non-slip rug tape.  Do not have throw rugs and other things on  the floor that can make you trip. What can I do in the bedroom?  Use night lights.  Make sure that you have a light by your bed that is easy to reach.  Do not use any sheets or blankets that are too big for your bed. They should not hang down onto the floor.  Have a firm chair that has side arms. You can use this for support while you get dressed.  Do not have throw rugs and other things on the floor that can make you trip. What can I do in the kitchen?  Clean up any spills right away.  Avoid walking on wet floors.  Keep items that you use a lot in easy-to-reach places.  If you need to reach something above you, use a strong step stool that has a grab bar.  Keep electrical cords out of the way.  Do not use floor polish or wax that makes floors slippery. If you must use wax, use non-skid floor wax.  Do not have throw rugs and other things on the floor that can make you trip. What can I do with my stairs?  Do not leave any items on the stairs.  Make sure  that there are handrails on both sides of the stairs and use them. Fix handrails that are broken or loose. Make sure that handrails are as long as the stairways.  Check any carpeting to make sure that it is firmly attached to the stairs. Fix any carpet that is loose or worn.  Avoid having throw rugs at the top or bottom of the stairs. If you do have throw rugs, attach them to the floor with carpet tape.  Make sure that you have a light switch at the top of the stairs and the bottom of the stairs. If you do not have them, ask someone to add them for you. What else can I do to help prevent falls?  Wear shoes that:  Do not have high heels.  Have rubber bottoms.  Are comfortable and fit you well.  Are closed at the toe. Do not wear sandals.  If you use a stepladder:  Make sure that it is fully opened. Do not climb a closed stepladder.  Make sure that both sides of the stepladder are locked into place.  Ask someone to  hold it for you, if possible.  Clearly mark and make sure that you can see:  Any grab bars or handrails.  First and last steps.  Where the edge of each step is.  Use tools that help you move around (mobility aids) if they are needed. These include:  Canes.  Walkers.  Scooters.  Crutches.  Turn on the lights when you go into a dark area. Replace any light bulbs as soon as they burn out.  Set up your furniture so you have a clear path. Avoid moving your furniture around.  If any of your floors are uneven, fix them.  If there are any pets around you, be aware of where they are.  Review your medicines with your doctor. Some medicines can make you feel dizzy. This can increase your chance of falling. Ask your doctor what other things that you can do to help prevent falls. This information is not intended to replace advice given to you by your health care provider. Make sure you discuss any questions you have with your health care provider. Document Released: 11/15/2008 Document Revised: 06/27/2015 Document Reviewed: 02/23/2014 Elsevier Interactive Patient Education  2017 [employer].

## 2017-12-16 NOTE — Progress Notes (Signed)
Subjective:     Patient ID: Patrick Massey , male    DOB: 1940/11/15 , 77 y.o.   MRN: [id number]   Chief Complaint  Patient presents with  . Diabetes    HPI  Diabetes  He presents for his follow-up diabetic visit. He has type 2 diabetes mellitus. His weight is stable. He is following a diabetic diet. His overall blood glucose range is 140-180 mg/dl. He does not see a podiatrist.Eye exam is current.  Hypertension  Chronicity: followed by Dr. Martinique next appt next month. The current episode started more than 1 year ago. The problem is unchanged. The problem is controlled. Pertinent negatives include no anxiety, malaise/fatigue, palpitations or shortness of breath. Risk factors for coronary artery disease include diabetes mellitus, male gender, obesity, dyslipidemia and sedentary lifestyle. Past treatments include beta blockers. The current treatment provides no improvement. There are no compliance problems.  There is no history of angina or kidney disease. There is no history of chronic renal disease.     Past Medical History:  Diagnosis Date  . Arthritis   . Bilateral lower extremity edema    feet/ankles  . CAD (coronary artery disease) cardiology--  dr Martinique     hx MI 07-20-2009  s/p  CABG x3 for critical disease LCx and moderate to severe disease LM and proximal RCA  . CKD (chronic kidney disease), stage II   . Diastolic CHF ([place]) 31/5400  . ED (erectile dysfunction)   . History of acute myocardial infarction 07/20/2009  . History of chronic prostatitis   . History of DVT of lower extremity 08/05/2009   right lower extremity post op CABG 07-24-2009  . History of pulmonary embolus (PE) 08/05/2009   post op CABG 07-24-2009  . History of syncope    09-07-2017 and recurrent one week later,  per event monitor results no arrythemias  . Hyperlipidemia   . Hypertension   . PAF (paroxysmal atrial fibrillation) ([place]) 07/2009   episode post op CABG   . Primary hypogonadism in male   .  Prostate cancer [facility]) urologist-  dr eskridge/  oncologist-  dr Tresa    dx 02-17-2017-- Stage T1c,  Gleason 3+3,  PSA 16.30-- scheduled for radioactive seed implants 10-05-2017  . Type 2 diabetes mellitus ([place])    followed by pcp--  per pt last A1c 9.0 about 06/ 2019     Family History  Problem Relation Age of Onset  . Brain cancer Mother        died age 25  . Heart attack Father        age 32  . Breast cancer Sister   . Colon cancer Maternal Uncle   . Prostate cancer Maternal Uncle      Current Outpatient Medications:  .  aspirin EC 81 MG EC tablet, Take 1 tablet (81 mg total) by mouth daily., Disp: 30 tablet, Rfl: 0 .  atorvastatin (LIPITOR) 80 MG tablet, Take 1 tablet (80 mg total) by mouth daily. (Patient taking differently: Take 80 mg by mouth every morning. ), Disp: 90 tablet, Rfl: 3 .  carvedilol (COREG) 12.5 MG tablet, Take 12.5 mg by mouth 2 (two) times daily with a meal., Disp: , Rfl:  .  diltiazem (CARDIZEM CD) 240 MG 24 hr capsule, Take 1 capsule (240 mg total) by mouth daily. (Patient taking differently: Take 240 mg by mouth every morning. ), Disp: 90 capsule, Rfl: 3 .  glimepiride (AMARYL) 4 MG tablet, Take 4 mg by mouth Twice daily. ,  Disp: , Rfl:  .  glipiZIDE (GLUCOTROL XL) 2.5 MG 24 hr tablet, Take 2.5 mg by mouth 2 (two) times daily. , Disp: , Rfl:  .  JARDIANCE 10 MG TABS tablet, , Disp: , Rfl:  .  levETIRAcetam (KEPPRA) 500 MG tablet, Take 1 tablet (500 mg total) by mouth 2 (two) times daily., Disp: 120 tablet, Rfl: 11 .  metFORMIN (GLUCOPHAGE) 1000 MG tablet, Take 1,000 mg by mouth 2 (two) times daily with a meal. , Disp: , Rfl:  .  ONETOUCH DELICA LANCETS 16O MISC, , Disp: , Rfl:  .  ONETOUCH VERIO test strip, USE AS DIRECTED TO CHECK BLOOD SUGARS 1 TIME PER DAYDX: E11.65, Disp: , Rfl: 11 .  oxyCODONE-acetaminophen (PERCOCET) 5-325 MG tablet, Take 1-2 tablets by mouth every 6 (six) hours as needed for severe pain., Disp: 12 tablet, Rfl: 0 .  tamsulosin (FLOMAX)  0.4 MG CAPS capsule, Take 1 capsule (0.4 mg total) by mouth daily after supper., Disp: 30 capsule, Rfl: 1 .  valsartan (DIOVAN) 160 MG tablet, Take 160 mg by mouth every morning. , Disp: , Rfl:  .  Vitamin D, Ergocalciferol, (DRISDOL) 50000 UNITS CAPS capsule, Take 50,000 Units by mouth 2 (two) times a week. , Disp: , Rfl: 1 .  carvedilol (COREG) 12.5 MG tablet, TAKE 1 TABLET (12.5 MG TOTAL) BY MOUTH 2 (TWO) TIMES DAILY. (Patient not taking: Reported on 12/16/2017), Disp: 180 tablet, Rfl: 0   Allergies  Allergen Reactions  . Heparin Other (See Comments)    REACTION: platelets decreased     Review of Systems  Constitutional: Negative.  Negative for malaise/fatigue.  HENT: Negative.   Eyes: Negative.   Respiratory: Negative.  Negative for shortness of breath.   Cardiovascular: Negative.  Negative for palpitations.  Gastrointestinal: Negative.   Endocrine: Negative.   Genitourinary: Negative.   Musculoskeletal: Negative.   Allergic/Immunologic: Negative.   Neurological: Negative.   Hematological: Negative.   Psychiatric/Behavioral: Negative.      Today's Vitals   12/16/17 1441  BP: 110/68  Pulse: 60  Temp: (!) 97.3 F (36.3 C)  TempSrc: Oral  SpO2: 97%  Weight: 222 lb 12.8 oz (101.1 kg)  Height: 5' 5"  (1.651 m)  PainSc: 0-No pain   Body mass index is 37.08 kg/m.   Objective:  Physical Exam  Constitutional: He is oriented to person, place, and time. He appears well-developed and well-nourished.  HENT:  Head: Normocephalic.  Eyes: Pupils are equal, round, and reactive to light. Conjunctivae and EOM are normal.  Neck: Normal range of motion. Neck supple.  Cardiovascular: Normal rate, regular rhythm and normal heart sounds.  Pulmonary/Chest: Effort normal and breath sounds normal.  Abdominal: Soft. Bowel sounds are normal.  Musculoskeletal: Normal range of motion.  Neurological: He is oriented to person, place, and time.  Skin: Skin is warm and dry. Capillary refill  takes less than 2 seconds.        Assessment And Plan:     1. Type 2 diabetes mellitus without complication, without long-term current use of insulin (HCC)  Chronic, poorly controlled  Pt reports he is taking his medications daily when he is able to afford them.   Continue with current medications - CMP14+EGFR - CBC no Diff - Hemoglobin A1c - Lipid panel  2. 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 Nephropathy  4. Atherosclerosis of native coronary artery of native heart without angina pectoris  Chronic, stable  Continue with current medications and follow up with Cardiology      Minette Brine, FNP
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
## 2017-12-17 LAB — CMP14+EGFR
A/G RATIO: 1.3 (ref 1.2–2.2)
ALBUMIN: 3.9 g/dL (ref 3.5–4.8)
ALK PHOS: 115 IU/L (ref 39–117)
ALT: 13 IU/L (ref 0–44)
AST: 15 IU/L (ref 0–40)
BILIRUBIN TOTAL: 0.6 mg/dL (ref 0.0–1.2)
BUN / CREAT RATIO: 16 (ref 10–24)
BUN: 20 mg/dL (ref 8–27)
CHLORIDE: 96 mmol/L (ref 96–106)
CO2: 22 mmol/L (ref 20–29)
Calcium: 9.3 mg/dL (ref 8.6–10.2)
Creatinine, Ser: 1.22 mg/dL (ref 0.76–1.27)
GFR calc Af Amer: 66 mL/min/{1.73_m2} (ref >59)
GFR calc non Af Amer: 57 mL/min/{1.73_m2} — ABNORMAL LOW (ref >59)
GLOBULIN, TOTAL: 2.9 g/dL (ref 1.5–4.5)
Glucose: 353 mg/dL — ABNORMAL HIGH (ref 65–99)
POTASSIUM: 4.7 mmol/L (ref 3.5–5.2)
SODIUM: 135 mmol/L (ref 134–144)
Total Protein: 6.8 g/dL (ref 6.0–8.5)

## 2017-12-17 LAB — LIPID PANEL
CHOL/HDL RATIO: 4.4 ratio (ref 0.0–5.0)
Cholesterol, Total: 222 mg/dL — ABNORMAL HIGH (ref 100–199)
HDL: 51 mg/dL (ref >39)
LDL Calculated: 147 mg/dL — ABNORMAL HIGH (ref 0–99)
Triglycerides: 118 mg/dL (ref 0–149)
VLDL CHOLESTEROL CAL: 24 mg/dL (ref 5–40)

## 2017-12-17 LAB — CBC
Hematocrit: 38.7 % (ref 37.5–51.0)
Hemoglobin: 12.4 g/dL — ABNORMAL LOW (ref 13.0–17.7)
MCH: 26.3 pg — ABNORMAL LOW (ref 26.6–33.0)
MCHC: 32 g/dL (ref 31.5–35.7)
MCV: 82 fL (ref 79–97)
PLATELETS: 259 10*3/uL (ref 150–450)
RBC: 4.72 x10E6/uL (ref 4.14–5.80)
RDW: 12 % — AB (ref 12.3–15.4)
WBC: 8.3 10*3/uL (ref 3.4–10.8)

## 2017-12-17 LAB — HEMOGLOBIN A1C
Est. average glucose Bld gHb Est-mCnc: 260 mg/dL
HEMOGLOBIN A1C: 10.7 % — AB (ref 4.8–5.6)

## 2017-12-23 ENCOUNTER — Other Ambulatory Visit: Payer: Self-pay

## 2017-12-27 DIAGNOSIS — I1310 Hypertensive heart and chronic kidney disease without heart failure, with stage 1 through stage 4 chronic kidney disease, or unspecified chronic kidney disease: Secondary | ICD-10-CM | POA: Insufficient documentation

## 2017-12-27 DIAGNOSIS — N289 Disorder of kidney and ureter, unspecified: Secondary | ICD-10-CM | POA: Insufficient documentation

## 2017-12-27 DIAGNOSIS — E119 Type 2 diabetes mellitus without complications: Secondary | ICD-10-CM | POA: Insufficient documentation

## 2017-12-27 DIAGNOSIS — N182 Chronic kidney disease, stage 2 (mild): Secondary | ICD-10-CM

## 2017-12-27 DIAGNOSIS — C61 Malignant neoplasm of prostate: Secondary | ICD-10-CM | POA: Diagnosis not present

## 2017-12-29 ENCOUNTER — Other Ambulatory Visit: Payer: Self-pay

## 2017-12-29 ENCOUNTER — Ambulatory Visit: Payer: Medicare HMO | Admitting: Cardiology

## 2017-12-29 MED ORDER — SEMAGLUTIDE(0.25 OR 0.5MG/DOS) 2 MG/1.5ML ~~LOC~~ SOPN: 0.5000 mg | PEN_INJECTOR | SUBCUTANEOUS | 6 refills | Status: DC

## 2017-12-31 ENCOUNTER — Other Ambulatory Visit: Payer: Self-pay | Admitting: Cardiology
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
## 2018-01-03 NOTE — Progress Notes (Signed)
Error in message.  

## 2018-01-04 DIAGNOSIS — R350 Frequency of micturition: Secondary | ICD-10-CM | POA: Diagnosis not present

## 2018-01-04 DIAGNOSIS — C61 Malignant neoplasm of prostate: Secondary | ICD-10-CM | POA: Diagnosis not present

## 2018-01-05 ENCOUNTER — Encounter: Payer: Self-pay | Admitting: Cardiology

## 2018-01-05 ENCOUNTER — Ambulatory Visit: Payer: Medicare HMO | Admitting: Cardiology

## 2018-01-05 VITALS — BP 112/67 | HR 68 | Ht 68.0 in | Wt 223.0 lb

## 2018-01-05 DIAGNOSIS — I2510 Atherosclerotic heart disease of native coronary artery without angina pectoris: Secondary | ICD-10-CM

## 2018-01-05 DIAGNOSIS — E7800 Pure hypercholesterolemia, unspecified: Secondary | ICD-10-CM

## 2018-01-05 DIAGNOSIS — I10 Essential (primary) hypertension: Secondary | ICD-10-CM

## 2018-01-05 MED ORDER — EZETIMIBE 10 MG PO TABS: 10.0000 mg | ORAL_TABLET | Freq: Every day | ORAL | 3 refills | Status: DC

## 2018-01-05 NOTE — Patient Instructions (Signed)
Add Zetia 10 mg daily for cholesterol. We will repeat lipid panel in 3 months  Make sure you are taking lipitor 80 mg daily

## 2018-01-31 ENCOUNTER — Telehealth: Payer: Self-pay | Admitting: *Deleted

## 2018-01-31 ENCOUNTER — Ambulatory Visit: Payer: Medicare HMO | Admitting: Diagnostic Neuroimaging

## 2018-01-31 ENCOUNTER — Encounter: Payer: Self-pay | Admitting: Diagnostic Neuroimaging

## 2018-01-31 NOTE — Telephone Encounter (Signed)
Patient was no show for follow up today. 

## 2018-03-17 ENCOUNTER — Ambulatory Visit (INDEPENDENT_AMBULATORY_CARE_PROVIDER_SITE_OTHER): Payer: Medicare HMO | Admitting: Nurse Practitioner

## 2018-03-17 ENCOUNTER — Encounter: Payer: Self-pay | Admitting: Nurse Practitioner

## 2018-03-17 VITALS — BP 112/60 | HR 60 | Temp 98.0°F | Ht 63.0 in | Wt 227.2 lb

## 2018-03-17 DIAGNOSIS — N182 Chronic kidney disease, stage 2 (mild): Secondary | ICD-10-CM | POA: Diagnosis not present

## 2018-03-17 DIAGNOSIS — I1310 Hypertensive heart and chronic kidney disease without heart failure, with stage 1 through stage 4 chronic kidney disease, or unspecified chronic kidney disease: Secondary | ICD-10-CM | POA: Diagnosis not present

## 2018-03-17 DIAGNOSIS — E119 Type 2 diabetes mellitus without complications: Secondary | ICD-10-CM

## 2018-03-17 DIAGNOSIS — N289 Disorder of kidney and ureter, unspecified: Secondary | ICD-10-CM | POA: Diagnosis not present

## 2018-03-17 DIAGNOSIS — Z79899 Other long term (current) drug therapy: Secondary | ICD-10-CM | POA: Diagnosis not present

## 2018-03-17 DIAGNOSIS — R69 Illness, unspecified: Secondary | ICD-10-CM | POA: Diagnosis not present

## 2018-03-17 DIAGNOSIS — I2510 Atherosclerotic heart disease of native coronary artery without angina pectoris: Secondary | ICD-10-CM | POA: Diagnosis not present

## 2018-03-17 MED ORDER — DULAGLUTIDE 1.5 MG/0.5ML ~~LOC~~ SOAJ: 1.5000 mg | SUBCUTANEOUS | 6 refills | Status: AC

## 2018-03-17 MED ORDER — ONETOUCH DELICA LANCETS 33G MISC: 3 refills | Status: DC

## 2018-03-17 MED ORDER — ONETOUCH VERIO VI STRP: 1.0000 | ORAL_STRIP | Freq: Four times a day (QID) | 3 refills | Status: AC

## 2018-03-17 NOTE — Progress Notes (Signed)
Subjective:     Patient ID: Patrick Massey , male    DOB: April 18, 1940 , 78 y.o.   MRN: [id number]   Chief Complaint  Patient presents with  . Hypertension  . Diabetes    patient is supposed to be taking ozempic but his insurance does not cover it.    HPI  Hypertension  This is a chronic problem. The current episode started more than 1 year ago. The problem is controlled. Pertinent negatives include no anxiety, blurred vision, chest pain, headaches, palpitations or shortness of breath. There is no history of angina. There is no history of chronic renal disease or a hypertension causing med.  Diabetes  He presents for his follow-up diabetic visit. He has type 2 diabetes mellitus. His disease course has been stable. There are no hypoglycemic associated symptoms. Pertinent negatives for hypoglycemia include no dizziness or headaches. Pertinent negatives for diabetes include no blurred vision, no chest pain, no fatigue, no polydipsia, no polyphagia and no polyuria. There are no hypoglycemic complications. There are no diabetic complications. Risk factors for coronary artery disease include diabetes mellitus, hypertension, male sex and sedentary lifestyle. Current diabetic treatment includes oral agent (dual therapy). He is compliant with treatment most of the time. He is following a generally unhealthy diet. He has not had a previous visit with a dietitian. He rarely participates in exercise. (Blood sugar 150-175.  ) An ACE inhibitor/angiotensin II receptor blocker is being taken. He does not see a podiatrist.Eye exam is not current.     Past Medical History:  Diagnosis Date  . Arthritis   . Bilateral lower extremity edema    feet/ankles  . CAD (coronary artery disease) cardiology--  dr Martinique     hx MI 07-20-2009  s/p  CABG x3 for critical disease LCx and moderate to severe disease LM and proximal RCA  . CKD (chronic kidney disease), stage II   . Diastolic CHF ([place]) 78/2956  . ED (erectile  dysfunction)   . History of acute myocardial infarction 07/20/2009  . History of chronic prostatitis   . History of DVT of lower extremity 08/05/2009   right lower extremity post op CABG 07-24-2009  . History of pulmonary embolus (PE) 08/05/2009   post op CABG 07-24-2009  . History of syncope    09-07-2017 and recurrent one week later,  per event monitor results no arrythemias  . Hyperlipidemia   . Hypertension   . PAF (paroxysmal atrial fibrillation) ([place]) 07/2009   episode post op CABG   . Primary hypogonadism in male   . Prostate cancer [facility]) urologist-  dr eskridge/  oncologist-  dr Tresa    dx 02-17-2017-- Stage T1c,  Gleason 3+3,  PSA 16.30-- scheduled for radioactive seed implants 10-05-2017  . Type 2 diabetes mellitus ([place])    followed by pcp--  per pt last A1c 9.0 about 06/ 2019     Family History  Problem Relation Age of Onset  . Brain cancer Mother        died age 54  . Heart attack Father        age 27  . Breast cancer Sister   . Colon cancer Maternal Uncle   . Prostate cancer Maternal Uncle      Current Outpatient Medications:  .  aspirin EC 81 MG EC tablet, Take 1 tablet (81 mg total) by mouth daily., Disp: 30 tablet, Rfl: 0 .  atorvastatin (LIPITOR) 80 MG tablet, Take 1 tablet (80 mg total) by mouth daily. (Patient  taking differently: Take 80 mg by mouth every morning. ), Disp: 90 tablet, Rfl: 3 .  carvedilol (COREG) 12.5 MG tablet, Take 12.5 mg by mouth 2 (two) times daily with a meal., Disp: , Rfl:  .  diltiazem (CARDIZEM CD) 240 MG 24 hr capsule, Take 1 capsule (240 mg total) by mouth daily. (Patient taking differently: Take 240 mg by mouth every morning. ), Disp: 90 capsule, Rfl: 3 .  ezetimibe (ZETIA) 10 MG tablet, Take 1 tablet (10 mg total) by mouth daily., Disp: 90 tablet, Rfl: 3 .  glimepiride (AMARYL) 4 MG tablet, Take 4 mg by mouth Twice daily. , Disp: , Rfl:  .  metFORMIN (GLUCOPHAGE) 1000 MG tablet, Take 1,000 mg by mouth 2 (two) times daily with a  meal. , Disp: , Rfl:  .  ONETOUCH DELICA LANCETS 83M MISC, , Disp: , Rfl:  .  ONETOUCH VERIO test strip, USE AS DIRECTED TO CHECK BLOOD SUGARS 1 TIME PER DAYDX: E11.65, Disp: , Rfl: 11 .  tamsulosin (FLOMAX) 0.4 MG CAPS capsule, Take 1 capsule (0.4 mg total) by mouth daily after supper., Disp: 30 capsule, Rfl: 1 .  valsartan (DIOVAN) 160 MG tablet, Take 160 mg by mouth every morning. , Disp: , Rfl:  .  Vitamin D, Ergocalciferol, (DRISDOL) 50000 UNITS CAPS capsule, Take 50,000 Units by mouth 2 (two) times a week. , Disp: , Rfl: 1 .  Semaglutide,0.25 or 0.5MG /DOS, (OZEMPIC, 0.25 OR 0.5 MG/DOSE,) 2 MG/1.5ML SOPN, Inject 0.5 mg into the skin once a week. Inject 0.5mg  by subcutaneous route on the same day of every week into the abdomen, thigh or upper arm rotating injection sites (Patient not taking: Reported on 03/17/2018), Disp: 1 pen, Rfl: 6   Allergies  Allergen Reactions  . Heparin Other (See Comments)    REACTION: platelets decreased     Review of Systems  Constitutional: Negative for fatigue.  Eyes: Negative for blurred vision.  Respiratory: Negative.  Negative for shortness of breath.   Cardiovascular: Negative for chest pain, palpitations and leg swelling.  Gastrointestinal: Negative.  Negative for constipation, diarrhea and nausea.  Endocrine: Negative for polydipsia, polyphagia and polyuria.  Skin: Negative.   Neurological: Negative for dizziness and headaches.  All other systems reviewed and are negative.    Today's Vitals   03/17/18 1452  BP: 112/60  Pulse: 60  Temp: 98 F (36.7 C)  TempSrc: Oral  Weight: 227 lb 3.2 oz (103.1 kg)  Height: 5\' 3"  (1.6 m)  PainSc: 0-No pain   Body mass index is 40.25 kg/m.   Objective:  Physical Exam Vitals signs reviewed.  Constitutional:      General: He is not in acute distress.    Appearance: Normal appearance. He is obese.  Cardiovascular:     Rate and Rhythm: Normal rate.     Pulses: Normal pulses.     Heart sounds: Normal  heart sounds. No murmur.  Pulmonary:     Effort: Pulmonary effort is normal. No respiratory distress.     Breath sounds: Normal breath sounds.  Musculoskeletal:     Right lower leg: No edema.     Left lower leg: No edema.  Skin:    General: Skin is warm and dry.     Capillary Refill: Capillary refill takes less than 2 seconds.  Neurological:     General: No focal deficit present.     Mental Status: He is alert and oriented to person, place, and time.  Psychiatric:  Mood and Affect: Mood normal.        Behavior: Behavior normal.        Thought Content: Thought content normal.        Judgment: Judgment normal.         Assessment And Plan:    1. Type 2 diabetes mellitus without complication, without long-term current use of insulin (HCC)  Chronic, poorly controlled  He has been without his Ozempic, sample given. Discussed with him the importance of taking his medications regularly.  He has been unable to afford the Ozempic at $300.  I will refer him to Nurse Case Manager for disease management.  I will also send a prescription for Trulicity to see if his insurance will cover this better.  Continue with current medications  Encouraged to limit intake of sugary foods and drinks  Encouraged to increase physical activity to 150 minutes per week - ONETOUCH DELICA LANCETS 82U MISC; Use as directed  Dispense: 300 each; Refill: 3 - ONETOUCH VERIO test strip; 1 each by Other route QID. Use as instructed  Dispense: 300 each; Refill: 3 - Lipid panel - CMP14 + Anion Gap - Hemoglobin A1c - TSH - Dulaglutide (TRULICITY) 1.5 MP/5.3IR SOPN; Inject 1.5 mg into the skin once a week.  Dispense: 4 pen; Refill: 6 - Referral to Chronic Care Management Services  2. 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  The importance of regular exercise and dietary modification was stressed to the patient.  Stressed importance of  losing ten percent of her body weight to help with B/P control.  The weight loss would help with decreasing cardiac and cancer risk as well.   3. Nephropathy  Stable.  4. Atherosclerosis of native coronary artery of native heart without angina pectoris  Chronic  Stable  Continue with current medications  5. Other long term (current) drug therapy - TSH         Minette Brine, FNP

## 2018-03-18 LAB — CMP14 + ANION GAP
A/G RATIO: 1.3 (ref 1.2–2.2)
ALT: 14 IU/L (ref 0–44)
AST: 15 IU/L (ref 0–40)
Albumin: 3.9 g/dL (ref 3.7–4.7)
Alkaline Phosphatase: 127 IU/L — ABNORMAL HIGH (ref 39–117)
Anion Gap: 13 mmol/L (ref 10.0–18.0)
BUN/Creatinine Ratio: 14 (ref 10–24)
BUN: 16 mg/dL (ref 8–27)
Bilirubin Total: 0.6 mg/dL (ref 0.0–1.2)
CO2: 24 mmol/L (ref 20–29)
Calcium: 9.2 mg/dL (ref 8.6–10.2)
Chloride: 99 mmol/L (ref 96–106)
Creatinine, Ser: 1.13 mg/dL (ref 0.76–1.27)
GFR calc Af Amer: 72 mL/min/{1.73_m2} (ref >59)
GFR calc non Af Amer: 62 mL/min/{1.73_m2} (ref >59)
Globulin, Total: 3 g/dL (ref 1.5–4.5)
Glucose: 324 mg/dL — ABNORMAL HIGH (ref 65–99)
POTASSIUM: 4.5 mmol/L (ref 3.5–5.2)
Sodium: 136 mmol/L (ref 134–144)
Total Protein: 6.9 g/dL (ref 6.0–8.5)

## 2018-03-18 LAB — HEMOGLOBIN A1C
Est. average glucose Bld gHb Est-mCnc: 263 mg/dL
Hgb A1c MFr Bld: 10.8 % — ABNORMAL HIGH (ref 4.8–5.6)

## 2018-03-18 LAB — LIPID PANEL
Chol/HDL Ratio: 3.7 ratio (ref 0.0–5.0)
Cholesterol, Total: 198 mg/dL (ref 100–199)
HDL: 53 mg/dL (ref >39)
LDL Calculated: 124 mg/dL — ABNORMAL HIGH (ref 0–99)
Triglycerides: 103 mg/dL (ref 0–149)
VLDL Cholesterol Cal: 21 mg/dL (ref 5–40)

## 2018-03-18 LAB — TSH: TSH: 4.88 u[IU]/mL — ABNORMAL HIGH (ref 0.450–4.500)

## 2018-03-21 ENCOUNTER — Ambulatory Visit: Payer: Self-pay | Admitting: *Deleted

## 2018-03-21 NOTE — Chronic Care Management (AMB) (Signed)
  Care Management Note   Patrick Massey is a 78 y.o. year old male who sees Glendale Chard, MD for primary care. Dr. Baird Cancer asked the CCM team to consult the patient for assistance with chronic disease management related to DM, HTN.   Review of patient status, including review of consultants reports, relevant laboratory and other test results, and collaboration with appropriate care team members and the patient's provider was performed as part of comprehensive patient evaluation and provision of chronic care management services. Telephone outreach to patient today to introduce CCM services.   Follow Up Plan: The CM team will reach out to the patient again over the next 7  days.   [place] Coordinator [facility] Care Management [phone]

## 2018-03-24 ENCOUNTER — Ambulatory Visit: Payer: Self-pay

## 2018-03-24 DIAGNOSIS — R609 Edema, unspecified: Secondary | ICD-10-CM

## 2018-03-24 DIAGNOSIS — N182 Chronic kidney disease, stage 2 (mild): Secondary | ICD-10-CM

## 2018-03-24 DIAGNOSIS — E119 Type 2 diabetes mellitus without complications: Secondary | ICD-10-CM

## 2018-03-24 DIAGNOSIS — I1310 Hypertensive heart and chronic kidney disease without heart failure, with stage 1 through stage 4 chronic kidney disease, or unspecified chronic kidney disease: Secondary | ICD-10-CM

## 2018-03-24 NOTE — Chronic Care Management (AMB) (Signed)
  Care Management Note   Patrick Massey is a 78 y.o. year old male who sees Glendale Chard, MD for primary care. Dr. Baird Cancer asked the CM team to consult with the patient for assistance with Diabetes Education and [employer].   Unsuccessful outreach to [employer] by phone today to discuss CCM services. SW left a HIPAA compliant voice message requesting a return call.  Follow Up Plan: The patient will be outreached within the next week.  Daneen Schick, BSW, CDP [facility] Care Management Social Worker [phone]

## 2018-03-28 DIAGNOSIS — C61 Malignant neoplasm of prostate: Secondary | ICD-10-CM | POA: Diagnosis not present

## 2018-03-29 ENCOUNTER — Ambulatory Visit: Payer: Self-pay

## 2018-03-29 DIAGNOSIS — N182 Chronic kidney disease, stage 2 (mild): Secondary | ICD-10-CM

## 2018-03-29 DIAGNOSIS — I1310 Hypertensive heart and chronic kidney disease without heart failure, with stage 1 through stage 4 chronic kidney disease, or unspecified chronic kidney disease: Secondary | ICD-10-CM

## 2018-03-29 DIAGNOSIS — E119 Type 2 diabetes mellitus without complications: Secondary | ICD-10-CM

## 2018-03-29 NOTE — Chronic Care Management (AMB) (Signed)
  Chronic Care Management    Clinical Social Work CCM Outreach Note  03/29/2018 Name: Patrick Massey MRN: [id number] DOB: 1940/08/12  Second unsuccessful call to the patient regarding CCM enrollment. The patient has been referred by Dr. Baird Cancer for disease management related to HTN and DM. SW left a HIPAA compliant voice message requesting a return call.  Follow Up Plan: CCM team will reach out to client by phone within the next week to discuss CCM referral if a return call is not received.  Daneen Schick, BSW, CDP [facility] Care Management Social Worker [phone]  Total time spent performing care coordination and/or care management activities with the patient by phone or face to face = 5 minutes.

## 2018-04-04 ENCOUNTER — Ambulatory Visit: Payer: Self-pay

## 2018-04-04 DIAGNOSIS — N182 Chronic kidney disease, stage 2 (mild): Secondary | ICD-10-CM

## 2018-04-04 DIAGNOSIS — C61 Malignant neoplasm of prostate: Secondary | ICD-10-CM | POA: Diagnosis not present

## 2018-04-04 DIAGNOSIS — I1310 Hypertensive heart and chronic kidney disease without heart failure, with stage 1 through stage 4 chronic kidney disease, or unspecified chronic kidney disease: Secondary | ICD-10-CM

## 2018-04-04 DIAGNOSIS — R350 Frequency of micturition: Secondary | ICD-10-CM | POA: Diagnosis not present

## 2018-04-04 DIAGNOSIS — R3912 Poor urinary stream: Secondary | ICD-10-CM | POA: Diagnosis not present

## 2018-04-04 DIAGNOSIS — E119 Type 2 diabetes mellitus without complications: Secondary | ICD-10-CM

## 2018-04-04 NOTE — Chronic Care Management (AMB) (Signed)
  Care Management Note   Patrick Massey is a 78 y.o. year old male who is a primary care patient of Minette Brine, FNP. [name] asked the CM team to consult with the patient for assistance with diabetes education and medication costs.  Third unsuccessful outreach attempt to Patrick Massey to introduce CCM services. SW left a HIPAA compliant voice message requesting a return call.   Follow Up Plan: SW to close current CCM referral due to an inability to establish patient contact. The patient is still eligible for CCM services and may participate if a return call is received. SW will notify [name] of today's referral closure.  Daneen Schick, BSW, CDP [facility] Care Management Social Worker [phone]

## 2018-04-05 ENCOUNTER — Other Ambulatory Visit: Payer: Self-pay

## 2018-04-05 DIAGNOSIS — R69 Illness, unspecified: Secondary | ICD-10-CM | POA: Diagnosis not present

## 2018-04-05 DIAGNOSIS — E119 Type 2 diabetes mellitus without complications: Secondary | ICD-10-CM

## 2018-04-05 MED ORDER — ONETOUCH DELICA LANCETS 33G MISC: 3 refills | Status: AC

## 2018-05-17 DIAGNOSIS — R69 Illness, unspecified: Secondary | ICD-10-CM | POA: Diagnosis not present

## 2018-06-15 ENCOUNTER — Ambulatory Visit: Payer: Medicare HMO | Admitting: Internal Medicine

## 2018-06-28 DIAGNOSIS — C61 Malignant neoplasm of prostate: Secondary | ICD-10-CM | POA: Diagnosis not present

## 2018-07-05 DIAGNOSIS — Z8546 Personal history of malignant neoplasm of prostate: Secondary | ICD-10-CM | POA: Diagnosis not present

## 2018-07-05 DIAGNOSIS — R350 Frequency of micturition: Secondary | ICD-10-CM | POA: Diagnosis not present

## 2018-07-05 DIAGNOSIS — R3912 Poor urinary stream: Secondary | ICD-10-CM | POA: Diagnosis not present

## 2018-07-19 ENCOUNTER — Other Ambulatory Visit: Payer: Self-pay

## 2018-07-19 MED ORDER — EZETIMIBE 10 MG PO TABS: 10.0000 mg | ORAL_TABLET | Freq: Every day | ORAL | 2 refills | Status: AC

## 2018-08-01 ENCOUNTER — Telehealth: Payer: Self-pay | Admitting: Cardiology

## 2018-08-01 NOTE — Telephone Encounter (Signed)
New Message ° ° ° °Left message to confirm appt and answer COVID questions  °

## 2018-08-02 ENCOUNTER — Other Ambulatory Visit: Payer: Self-pay

## 2018-08-02 ENCOUNTER — Encounter: Payer: Self-pay | Admitting: Cardiology

## 2018-08-02 ENCOUNTER — Ambulatory Visit: Payer: Medicare HMO | Admitting: Cardiology

## 2018-08-02 VITALS — BP 122/72 | HR 64 | Temp 97.2°F | Ht 68.0 in | Wt 222.6 lb

## 2018-08-02 DIAGNOSIS — I25708 Atherosclerosis of coronary artery bypass graft(s), unspecified, with other forms of angina pectoris: Secondary | ICD-10-CM

## 2018-08-02 DIAGNOSIS — I10 Essential (primary) hypertension: Secondary | ICD-10-CM

## 2018-08-02 DIAGNOSIS — E785 Hyperlipidemia, unspecified: Secondary | ICD-10-CM

## 2018-08-02 MED ORDER — OZEMPIC (0.25 OR 0.5 MG/DOSE) 2 MG/1.5ML ~~LOC~~ SOPN: 0.5000 mg | PEN_INJECTOR | SUBCUTANEOUS | Status: AC

## 2018-08-02 NOTE — Telephone Encounter (Signed)
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
## 2018-08-02 NOTE — Telephone Encounter (Signed)
Patient returned your call.

## 2018-08-02 NOTE — Patient Instructions (Signed)
Continue  Your current therapy  I will make you an appointment to talk to our pharmacist about newer options for treating your cholesterol  I will see you in 6 months.

## 2018-08-13 DIAGNOSIS — R69 Illness, unspecified: Secondary | ICD-10-CM | POA: Diagnosis not present

## 2018-08-18 DIAGNOSIS — R69 Illness, unspecified: Secondary | ICD-10-CM | POA: Diagnosis not present

## 2018-08-29 ENCOUNTER — Ambulatory Visit (INDEPENDENT_AMBULATORY_CARE_PROVIDER_SITE_OTHER): Payer: Medicare HMO | Admitting: Pharmacist

## 2018-08-29 ENCOUNTER — Other Ambulatory Visit: Payer: Self-pay

## 2018-08-29 VITALS — BP 122/78 | Ht 68.0 in | Wt 221.8 lb

## 2018-08-29 DIAGNOSIS — E785 Hyperlipidemia, unspecified: Secondary | ICD-10-CM | POA: Diagnosis not present

## 2018-08-29 NOTE — Progress Notes (Signed)
Patient ID: Patrick Massey                 DOB: 08/09/1940                    MRN: [id number]     HPI: Patrick Massey is a 78 y.o. male patient referred to lipid clinic by Dr Martinique. PMH is significant for CAD with history of MI and CABG x 3  in 07/2009, DM, hypertension, hyperlipidemia, Afib, and diastolic HF.  Patient reports compliance with atorvastatin plus ezetimibe therapy but LDL remains above desired goal. Good candidate for PCSK9i therapy.   Current Medications:  Ezetimibe 10mg  daily Atorvastatin 80mg  daily  Intolerances: none   LDL goal: 70mg /d  Diet: 50/50 take out and home cooked meals  Exercise: walks 2-3 times per week and bowling in a leage  Family History:The patient's family history includes Brain cancer in his mother; Breast cancer in his sister; Colon cancer in his maternal uncle; Heart attack in his father; Prostate cancer in his maternal uncle.   Social History: denies tobacco or alcoholmuse  Labs: 03/17/2018: CHO 198; TG 103; HDL 53; LDL-c 124 - on atorvastatin 80mg  plus ezetimibe 10mg  daily  Past Medical History:  Diagnosis Date  . Arthritis   . Bilateral lower extremity edema    feet/ankles  . CAD (coronary artery disease) cardiology--  dr Martinique     hx MI 07-20-2009  s/p  CABG x3 for critical disease LCx and moderate to severe disease LM and proximal RCA  . CKD (chronic kidney disease), stage II   . Diastolic CHF ([place]) 41/6606  . ED (erectile dysfunction)   . History of acute myocardial infarction 07/20/2009  . History of chronic prostatitis   . History of DVT of lower extremity 08/05/2009   right lower extremity post op CABG 07-24-2009  . History of pulmonary embolus (PE) 08/05/2009   post op CABG 07-24-2009  . History of syncope    09-07-2017 and recurrent one week later,  per event monitor results no arrythemias  . Hyperlipidemia   . Hypertension   . PAF (paroxysmal atrial fibrillation) ([place]) 07/2009   episode post op CABG   . Primary  hypogonadism in male   . Prostate cancer [facility]) urologist-  dr eskridge/  oncologist-  dr Tresa Moore   dx 02-17-2017-- Stage T1c,  Gleason 3+3,  PSA 16.30-- scheduled for radioactive seed implants 10-05-2017  . Type 2 diabetes mellitus ([place])    followed by pcp--  per pt last A1c 9.0 about 06/ 2019    Current Outpatient Medications on File Prior to Visit  Medication Sig Dispense Refill  . aspirin EC 81 MG EC tablet Take 1 tablet (81 mg total) by mouth daily. 30 tablet 0  . atorvastatin (LIPITOR) 80 MG tablet Take 1 tablet (80 mg total) by mouth daily. (Patient taking differently: Take 80 mg by mouth every morning. ) 90 tablet 3  . carvedilol (COREG) 12.5 MG tablet Take 12.5 mg by mouth 2 (two) times daily with a meal.    . diltiazem (CARDIZEM CD) 240 MG 24 hr capsule Take 1 capsule (240 mg total) by mouth daily. (Patient taking differently: Take 240 mg by mouth every morning. ) 90 capsule 3  . Dulaglutide (TRULICITY) 1.5 TK/1.6WF SOPN Inject 1.5 mg into the skin once a week. 4 pen 6  . ezetimibe (ZETIA) 10 MG tablet Take 1 tablet (10 mg total) by mouth daily. 90 tablet 2  . glimepiride (  AMARYL) 4 MG tablet Take 4 mg by mouth Twice daily.     . metFORMIN (GLUCOPHAGE) 1000 MG tablet Take 1,000 mg by mouth 2 (two) times daily with a meal.     . ONETOUCH DELICA LANCETS 67J MISC Use one lancet in device to test glucose daily. Dx E11.9 300 each 3  . ONETOUCH VERIO test strip 1 each by Other route QID. Use as instructed 300 each 3  . Semaglutide,0.25 or 0.5MG /DOS, (OZEMPIC, 0.25 OR 0.5 MG/DOSE,) 2 MG/1.5ML SOPN Inject 0.5 mg into the skin once a week.    . tamsulosin (FLOMAX) 0.4 MG CAPS capsule Take 1 capsule (0.4 mg total) by mouth daily after supper. 30 capsule 1  . valsartan (DIOVAN) 160 MG tablet Take 160 mg by mouth every morning.     . Vitamin D, Ergocalciferol, (DRISDOL) 50000 UNITS CAPS capsule Take 50,000 Units by mouth 2 (two) times a week.   1   No current facility-administered medications on  file prior to visit.     Allergies  Allergen Reactions  . Heparin Other (See Comments)    REACTION: platelets decreased    Dyslipidemia LDL remains above goal for secondary prevention while on high intensity statin. Patient reports complaicne with medication therapy and continues to work with PCP on diet improvements for lipids and glucose control.  Repatha/Praluent indication, storage, administration, prior-authorization process, common side effects and patient assistance options were discussed during this visit. Patient agrees on moving ahead with Repatha SureClick 140mg  therapy. Sample was provided to patient for self administration (LOT [id number] EXP 10/22) and injection technique teaching. No problems during administration and no immediate reaction to medication noted.   Will proceed with PA paperwork and follow up with patient as needed.   Eilee Schader Rodriguez-Guzman PharmD, BCPS, [place] [id number] 08/31/2018 2:51 PM

## 2018-08-29 NOTE — Patient Instructions (Signed)
Lipid Clinic (pharmacist) [phone] M-F 7:30am to 4:30pm Mamye Bolds/Kristin  *START paperwork for Repatha 140mg  every 14 fdays* ONCE Repatha 140mg  started, okay to stop taking ezetimibe*  REPEAT fasting blood work 3 months after starting Repatha   High Cholesterol  High cholesterol is a condition in which the blood has high levels of a white, waxy, fat-like substance (cholesterol). The human body needs small amounts of cholesterol. The liver makes all the cholesterol that the body needs. Extra (excess) cholesterol comes from the food that we eat. Cholesterol is carried from the liver by the blood through the blood vessels. If you have high cholesterol, deposits (plaques) may build up on the walls of your blood vessels (arteries). Plaques make the arteries narrower and stiffer. Cholesterol plaques increase your risk for heart attack and stroke. Work with your health care provider to keep your cholesterol levels in a healthy range. What increases the risk? This condition is more likely to develop in people who:  Eat foods that are high in animal fat (saturated fat) or cholesterol.  Are overweight.  Are not getting enough exercise.  Have a family history of high cholesterol. What are the signs or symptoms? There are no symptoms of this condition. How is this diagnosed? This condition may be diagnosed from the results of a blood test.  If you are older than age 64, your health care provider may check your cholesterol every 4-6 years.  You may be checked more often if you already have high cholesterol or other risk factors for heart disease. The blood test for cholesterol measures:  "Bad" cholesterol (LDL cholesterol). This is the main type of cholesterol that causes heart disease. The desired level for LDL is less than 100.  "Good" cholesterol (HDL cholesterol). This type helps to protect against heart disease by cleaning the arteries and carrying the LDL away. The desired level for  HDL is 60 or higher.  Triglycerides. These are fats that the body can store or burn for energy. The desired number for triglycerides is lower than 150.  Total cholesterol. This is a measure of the total amount of cholesterol in your blood, including LDL cholesterol, HDL cholesterol, and triglycerides. A healthy number is less than 200. How is this treated? This condition is treated with diet changes, lifestyle changes, and medicines. Diet changes  This may include eating more whole grains, fruits, vegetables, nuts, and fish.  This may also include cutting back on red meat and foods that have a lot of added sugar. Lifestyle changes  Changes may include getting at least 40 minutes of aerobic exercise 3 times a week. Aerobic exercises include walking, biking, and swimming. Aerobic exercise along with a healthy diet can help you maintain a healthy weight.  Changes may also include quitting smoking. Medicines  Medicines are usually given if diet and lifestyle changes have failed to reduce your cholesterol to healthy levels.  Your health care provider may prescribe a statin medicine. Statin medicines have been shown to reduce cholesterol, which can reduce the risk of heart disease. Follow these instructions at home: Eating and drinking If told by your health care provider:  Eat chicken (without skin), fish, veal, shellfish, ground [place] breast, and round or loin cuts of red meat.  Do not eat fried foods or fatty meats, such as hot dogs and salami.  Eat plenty of fruits, such as apples.  Eat plenty of vegetables, such as broccoli, potatoes, and carrots.  Eat beans, peas, and lentils.  Eat grains such as  barley, rice, couscous, and bulgur wheat.  Eat pasta without cream sauces.  Use skim or nonfat milk, and eat low-fat or nonfat yogurt and cheeses.  Do not eat or drink whole milk, cream, ice cream, egg yolks, or hard cheeses.  Do not eat stick margarine or tub margarines that  contain trans fats (also called partially hydrogenated oils).  Do not eat saturated tropical oils, such as coconut oil and palm oil.  Do not eat cakes, cookies, crackers, or other baked goods that contain trans fats.  General instructions  Exercise as directed by your health care provider. Increase your activity level with activities such as gardening, walking, and taking the stairs.  Take over-the-counter and prescription medicines only as told by your health care provider.  Do not use any products that contain nicotine or tobacco, such as cigarettes and e-cigarettes. If you need help quitting, ask your health care provider.  Keep all follow-up visits as told by your health care provider. This is important. Contact a health care provider if:  You are struggling to maintain a healthy diet or weight.  You need help to start on an exercise program.  You need help to stop smoking. Get help right away if:  You have chest pain.  You have trouble breathing. This information is not intended to replace advice given to you by your health care provider. Make sure you discuss any questions you have with your health care provider. Document Released: 01/19/2005 Document Revised: 01/22/2017 Document Reviewed: 07/20/2015 Elsevier Patient Education  2020 [employer].

## 2018-08-31 ENCOUNTER — Encounter: Payer: Self-pay | Admitting: Pharmacist

## 2018-08-31 NOTE — Assessment & Plan Note (Signed)
LDL remains above goal for secondary prevention while on high intensity statin. Patient reports complaicne with medication therapy and continues to work with PCP on diet improvements for lipids and glucose control.  Repatha/Praluent indication, storage, administration, prior-authorization process, common side effects and patient assistance options were discussed during this visit. Patient agrees on moving ahead with Repatha SureClick 140mg  therapy. Sample was provided to patient for self administration (LOT [id number] EXP 10/22) and injection technique teaching. No problems during administration and no immediate reaction to medication noted.   Will proceed with PA paperwork and follow up with patient as needed.

## 2018-09-05 ENCOUNTER — Telehealth: Payer: Self-pay

## 2018-09-05 MED ORDER — REPATHA SURECLICK 140 MG/ML ~~LOC~~ SOAJ: 140.0000 mg | SUBCUTANEOUS | 6 refills | Status: AC

## 2018-09-05 NOTE — Telephone Encounter (Signed)
Called and left a msg stating that the repatha was approved and sent to the local pharmacy and to call back w/?'s

## 2018-09-09 ENCOUNTER — Telehealth: Payer: Self-pay | Admitting: Pharmacist

## 2018-09-09 NOTE — Telephone Encounter (Signed)
New Message     Pt is calling to speak with Raquel   Please call back

## 2018-09-12 NOTE — Telephone Encounter (Signed)
LMOM; patient to call back as needed

## 2018-09-13 NOTE — Telephone Encounter (Signed)
Patient returned your call.

## 2018-09-13 NOTE — Telephone Encounter (Signed)
Patient unable to afford Repatha with co-pay of $300. Will apply to [employer] for assistance.

## 2018-09-13 NOTE — Telephone Encounter (Signed)
Returned call. Left another message. Unable to assess patient need at this time.  LMOM; please call back at [phone] ans ask to speak to pharmacist.

## 2018-09-19 ENCOUNTER — Other Ambulatory Visit: Payer: Self-pay

## 2018-09-19 MED ORDER — ATORVASTATIN CALCIUM 80 MG PO TABS: 80.0000 mg | ORAL_TABLET | Freq: Every day | ORAL | 3 refills | Status: AC

## 2018-10-05 DIAGNOSIS — Z8546 Personal history of malignant neoplasm of prostate: Secondary | ICD-10-CM | POA: Diagnosis not present

## 2018-10-08 DIAGNOSIS — I469 Cardiac arrest, cause unspecified: Secondary | ICD-10-CM | POA: Diagnosis not present

## 2018-10-08 DIAGNOSIS — R404 Transient alteration of awareness: Secondary | ICD-10-CM | POA: Diagnosis not present

## 2018-10-24 ENCOUNTER — Other Ambulatory Visit: Payer: Self-pay

## 2018-10-24 MED ORDER — DILTIAZEM HCL ER COATED BEADS 240 MG PO CP24: 240.0000 mg | ORAL_CAPSULE | Freq: Every day | ORAL | 3 refills | Status: AC

## 2018-11-03 DIAGNOSIS — 419620001 Death: Secondary | SNOMED CT | POA: Diagnosis not present

## 2018-11-03 DEATH — deceased

## 2018-12-22 ENCOUNTER — Ambulatory Visit: Payer: Medicare HMO | Admitting: Nurse Practitioner

## 2018-12-22 ENCOUNTER — Ambulatory Visit: Payer: Medicare HMO

## 2022-03-06 NOTE — Progress Notes (Signed)
error
